# Patient Record
Sex: Female | Born: 2005 | Race: Black or African American | Hispanic: No | Marital: Single | Smoking: Never smoker
Health system: Southern US, Community
[De-identification: ages and names within clinical notes are randomized; demographics above are authoritative.]

## PROBLEM LIST (undated history)

## (undated) DIAGNOSIS — L309 Dermatitis, unspecified: Secondary | ICD-10-CM

## (undated) DIAGNOSIS — R519 Headache, unspecified: Secondary | ICD-10-CM

## (undated) HISTORY — DX: Dermatitis, unspecified: L30.9

## (undated) HISTORY — DX: Headache, unspecified: R51.9

---
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
## 2023-01-06 ENCOUNTER — Telehealth (INDEPENDENT_AMBULATORY_CARE_PROVIDER_SITE_OTHER): Payer: Self-pay

## 2023-01-06 NOTE — Telephone Encounter (Signed)
Attempted to contact patients' parent to schedule New Patient appointment.    Parent unable to be reached.   LVM to call back.   SS, CCMA  

## 2023-01-14 DIAGNOSIS — F411 Generalized anxiety disorder: Secondary | ICD-10-CM | POA: Diagnosis not present

## 2023-01-14 DIAGNOSIS — F902 Attention-deficit hyperactivity disorder, combined type: Secondary | ICD-10-CM | POA: Diagnosis not present

## 2023-01-14 DIAGNOSIS — F330 Major depressive disorder, recurrent, mild: Secondary | ICD-10-CM | POA: Diagnosis not present

## 2023-01-28 DIAGNOSIS — F902 Attention-deficit hyperactivity disorder, combined type: Secondary | ICD-10-CM | POA: Diagnosis not present

## 2023-01-28 DIAGNOSIS — F411 Generalized anxiety disorder: Secondary | ICD-10-CM | POA: Diagnosis not present

## 2023-01-28 DIAGNOSIS — F330 Major depressive disorder, recurrent, mild: Secondary | ICD-10-CM | POA: Diagnosis not present

## 2023-02-11 DIAGNOSIS — F411 Generalized anxiety disorder: Secondary | ICD-10-CM | POA: Diagnosis not present

## 2023-02-11 DIAGNOSIS — F330 Major depressive disorder, recurrent, mild: Secondary | ICD-10-CM | POA: Diagnosis not present

## 2023-02-11 DIAGNOSIS — F902 Attention-deficit hyperactivity disorder, combined type: Secondary | ICD-10-CM | POA: Diagnosis not present

## 2023-02-25 DIAGNOSIS — F330 Major depressive disorder, recurrent, mild: Secondary | ICD-10-CM | POA: Diagnosis not present

## 2023-02-25 DIAGNOSIS — F411 Generalized anxiety disorder: Secondary | ICD-10-CM | POA: Diagnosis not present

## 2023-02-25 DIAGNOSIS — F902 Attention-deficit hyperactivity disorder, combined type: Secondary | ICD-10-CM | POA: Diagnosis not present

## 2023-03-11 DIAGNOSIS — F411 Generalized anxiety disorder: Secondary | ICD-10-CM | POA: Diagnosis not present

## 2023-03-11 DIAGNOSIS — F902 Attention-deficit hyperactivity disorder, combined type: Secondary | ICD-10-CM | POA: Diagnosis not present

## 2023-03-11 DIAGNOSIS — F330 Major depressive disorder, recurrent, mild: Secondary | ICD-10-CM | POA: Diagnosis not present

## 2023-03-25 DIAGNOSIS — F411 Generalized anxiety disorder: Secondary | ICD-10-CM | POA: Diagnosis not present

## 2023-03-25 DIAGNOSIS — F902 Attention-deficit hyperactivity disorder, combined type: Secondary | ICD-10-CM | POA: Diagnosis not present

## 2023-03-25 DIAGNOSIS — F330 Major depressive disorder, recurrent, mild: Secondary | ICD-10-CM | POA: Diagnosis not present

## 2023-04-06 DIAGNOSIS — N760 Acute vaginitis: Secondary | ICD-10-CM | POA: Diagnosis not present

## 2023-04-08 DIAGNOSIS — F411 Generalized anxiety disorder: Secondary | ICD-10-CM | POA: Diagnosis not present

## 2023-04-08 DIAGNOSIS — F902 Attention-deficit hyperactivity disorder, combined type: Secondary | ICD-10-CM | POA: Diagnosis not present

## 2023-04-08 DIAGNOSIS — F330 Major depressive disorder, recurrent, mild: Secondary | ICD-10-CM | POA: Diagnosis not present

## 2023-04-22 DIAGNOSIS — F902 Attention-deficit hyperactivity disorder, combined type: Secondary | ICD-10-CM | POA: Diagnosis not present

## 2023-04-22 DIAGNOSIS — F330 Major depressive disorder, recurrent, mild: Secondary | ICD-10-CM | POA: Diagnosis not present

## 2023-04-22 DIAGNOSIS — F411 Generalized anxiety disorder: Secondary | ICD-10-CM | POA: Diagnosis not present

## 2023-04-28 DIAGNOSIS — R519 Headache, unspecified: Secondary | ICD-10-CM | POA: Diagnosis not present

## 2023-04-28 DIAGNOSIS — Z13220 Encounter for screening for lipoid disorders: Secondary | ICD-10-CM | POA: Diagnosis not present

## 2023-04-28 DIAGNOSIS — R350 Frequency of micturition: Secondary | ICD-10-CM | POA: Diagnosis not present

## 2023-04-28 DIAGNOSIS — Z23 Encounter for immunization: Secondary | ICD-10-CM | POA: Diagnosis not present

## 2023-04-28 DIAGNOSIS — R110 Nausea: Secondary | ICD-10-CM | POA: Diagnosis not present

## 2023-04-28 DIAGNOSIS — F411 Generalized anxiety disorder: Secondary | ICD-10-CM | POA: Diagnosis not present

## 2023-04-28 DIAGNOSIS — R7303 Prediabetes: Secondary | ICD-10-CM | POA: Diagnosis not present

## 2023-04-28 DIAGNOSIS — F320 Major depressive disorder, single episode, mild: Secondary | ICD-10-CM | POA: Diagnosis not present

## 2023-04-28 DIAGNOSIS — K219 Gastro-esophageal reflux disease without esophagitis: Secondary | ICD-10-CM | POA: Diagnosis not present

## 2023-04-28 DIAGNOSIS — Z0000 Encounter for general adult medical examination without abnormal findings: Secondary | ICD-10-CM | POA: Diagnosis not present

## 2023-04-28 DIAGNOSIS — N760 Acute vaginitis: Secondary | ICD-10-CM | POA: Diagnosis not present

## 2023-05-06 DIAGNOSIS — F411 Generalized anxiety disorder: Secondary | ICD-10-CM | POA: Diagnosis not present

## 2023-05-06 DIAGNOSIS — F902 Attention-deficit hyperactivity disorder, combined type: Secondary | ICD-10-CM | POA: Diagnosis not present

## 2023-05-06 DIAGNOSIS — F330 Major depressive disorder, recurrent, mild: Secondary | ICD-10-CM | POA: Diagnosis not present

## 2023-05-08 ENCOUNTER — Ambulatory Visit (INDEPENDENT_AMBULATORY_CARE_PROVIDER_SITE_OTHER): Payer: Self-pay | Admitting: Pediatrics

## 2023-06-03 DIAGNOSIS — F902 Attention-deficit hyperactivity disorder, combined type: Secondary | ICD-10-CM | POA: Diagnosis not present

## 2023-06-03 DIAGNOSIS — F330 Major depressive disorder, recurrent, mild: Secondary | ICD-10-CM | POA: Diagnosis not present

## 2023-06-03 DIAGNOSIS — F411 Generalized anxiety disorder: Secondary | ICD-10-CM | POA: Diagnosis not present

## 2023-06-17 DIAGNOSIS — F411 Generalized anxiety disorder: Secondary | ICD-10-CM | POA: Diagnosis not present

## 2023-06-17 DIAGNOSIS — F902 Attention-deficit hyperactivity disorder, combined type: Secondary | ICD-10-CM | POA: Diagnosis not present

## 2023-06-17 DIAGNOSIS — F330 Major depressive disorder, recurrent, mild: Secondary | ICD-10-CM | POA: Diagnosis not present

## 2023-06-24 DIAGNOSIS — F330 Major depressive disorder, recurrent, mild: Secondary | ICD-10-CM | POA: Diagnosis not present

## 2023-07-01 DIAGNOSIS — F330 Major depressive disorder, recurrent, mild: Secondary | ICD-10-CM | POA: Diagnosis not present

## 2023-07-14 ENCOUNTER — Ambulatory Visit (HOSPITAL_COMMUNITY): Payer: BC Managed Care – PPO | Admitting: Family

## 2023-07-14 ENCOUNTER — Encounter (HOSPITAL_COMMUNITY): Payer: Self-pay | Admitting: Family

## 2023-07-14 VITALS — BP 118/77 | HR 80 | Ht 60.0 in | Wt 232.0 lb

## 2023-07-14 DIAGNOSIS — F4320 Adjustment disorder, unspecified: Secondary | ICD-10-CM | POA: Diagnosis not present

## 2023-07-14 DIAGNOSIS — K219 Gastro-esophageal reflux disease without esophagitis: Secondary | ICD-10-CM | POA: Diagnosis not present

## 2023-07-14 DIAGNOSIS — K5909 Other constipation: Secondary | ICD-10-CM | POA: Diagnosis not present

## 2023-07-14 DIAGNOSIS — R1084 Generalized abdominal pain: Secondary | ICD-10-CM | POA: Diagnosis not present

## 2023-07-14 MED ORDER — HYDROXYZINE PAMOATE 25 MG PO CAPS: 25.0000 mg | ORAL_CAPSULE | Freq: Every evening | ORAL | 0 refills | Status: AC | PRN

## 2023-07-14 NOTE — Progress Notes (Unsigned)
Psychiatric Initial Adult Assessment   Patient Identification: Carolyn Aguilar MRN:  [id number] Date of Evaluation:  07/15/2023 Referral Source: Primary care provider Chief Complaint:  " She needs some type of testing and she is not doing what she needs to do as a 17 year old, she seem to be behind mental as compared to her cousins"  Visit Diagnosis:    ICD-10-CM   1. Adjustment disorder, unspecified type  F43.20       History of Present Illness:  Carolyn Aguilar 17 year old African-American female presents to establish care.  Patient is accompanied by her mother.  Patient initially reports concerns with her memory.  Mother reports patient is currently followed by neurology however was advised to follow-up here for additional testing.  Mother states" she was 42 years old and she acts like a 17 year old."  Reports she is not successful unable to attend to daily affairs.  She denied that patient is ever been diagnosed with a learning disability, autism or experienced traumatic brain injury.  States patient has completed high school.  Mother reports compared to her cousins and siblings patient is not able to hold a job.  Denied that patient had IEP in school.   During this evaluation Carolyn Aguilar is very concrete, pleasant, cooperative.  Chart reviewed patient was followed by psychiatrist Carolyn Aguilar however,  states she did not take medications as directed.  States that Concerta made her symptoms worse as she reports decreased appetite, mood irritability and passive suicidal ideations. Mother reported " I think she is a hypochondriac because she mimicked all the symptoms that the psychiatrist told her could be possible with taking medications."   Carolyn Aguilar denied previous inpatient admissions.  Reports she was followed by psychiatrist 2 years prior however had only 1 visit prior to 17 year old.  States she is currently prescribed sertraline 100 mg that she has been taking and tolerating well.  States her  medication continued prescribed by her primary care provider denied illicit drug use or substance abuse history.  Adjustment disorder unspecified: Continue sertraline 100 mg daily Initiated hydroxyzine 25 mg p.o. nightly as needed may repeat dose  Carolyn Aguilar is sitting  she is alert/oriented x 4; calm/cooperative; and mood congruent with affect.  Patient is speaking in a clear tone at moderate volume, and normal pace; with good eye contact. Her thought process is coherent and relevant, limited and concrete; There is no indication that she is currently responding to internal/external stimuli or experiencing delusional thought content.  Patient denies suicidal/self-harm/homicidal ideation, psychosis, and paranoia.  Patient has remained calm throughout assessment and has answered questions appropriately.    Associated Signs/Symptoms: Depression Symptoms:  difficulty concentrating, (Hypo) Manic Symptoms:  Distractibility, Anxiety Symptoms:  Excessive Worry, Psychotic Symptoms:  Hallucinations: None PTSD Symptoms: NA  Past Psychiatric History:   Previous Psychotropic Medications: Yes currently she is prescribed sertraline 100 mg daily  Substance Abuse History in the last 12 months:  No.  Consequences of Substance Abuse: NA  Past Medical History:  Past Medical History:  Diagnosis Date   Asthma    Headache    History reviewed. No pertinent surgical history.  Family Psychiatric History:   Family History:  Family History  Problem Relation Age of Onset   Hypertension Mother    Diabetes Paternal Grandfather    Migraines Neg Hx     Social History:   Social History   Socioeconomic History   Marital status: Single    Spouse name: Not on file   Number  of children: Not on file   Years of education: Not on file   Highest education level: Not on file  Occupational History   Not on file  Tobacco Use   Smoking status: Never   Smokeless tobacco: Never  Vaping Use   Vaping  status: Never Used  Substance and Sexual Activity   Alcohol use: Never   Drug use: Never   Sexual activity: Never  Other Topics Concern   Not on file  Social History Narrative   12 th grade at [employer]   Lives at home with parents    Social Determinants of Health   Financial Resource Strain: Not on file  Food Insecurity: Food Insecurity Present (12/12/2022)   Hunger Vital Sign    Worried About Running Out of Food in the Last Year: Sometimes true    Ran Out of Food in the Last Year: Sometimes true  Transportation Needs: No Transportation Needs (12/12/2022)   PRAPARE - Administrator, Civil Service (Medical): No    Lack of Transportation (Non-Medical): No  Physical Activity: Not on file  Stress: Not on file  Social Connections: Not on file    Additional Social History:   Allergies:  No Known Allergies  Metabolic Disorder Labs: Lab Results  Component Value Date   HGBA1C 5.6 12/12/2022   No results found for: "PROLACTIN" No results found for: "CHOL", "TRIG", "HDL", "CHOLHDL", "VLDL", "LDLCALC" No results found for: "TSH"  Therapeutic Level Labs: No results found for: "LITHIUM" No results found for: "CBMZ" No results found for: "VALPROATE"  Current Medications: Current Outpatient Medications  Medication Sig Dispense Refill   albuterol (PROVENTIL HFA;VENTOLIN HFA) 108 (90 Base) MCG/ACT inhaler Inhale 2 puffs into the lungs every 6 (six) hours as needed. For shortness of breath 8 g 0   albuterol (PROVENTIL) (2.5 MG/3ML) 0.083% nebulizer solution Take 3 mLs (2.5 mg total) by nebulization every 6 (six) hours as needed for wheezing or shortness of breath. 75 mL 0   beclomethasone (QVAR) 40 MCG/ACT inhaler Inhale 2 puffs into the lungs 2 (two) times daily.     budesonide (PULMICORT) 0.5 MG/2ML nebulizer solution Take by nebulization daily.     cetirizine (ZYRTEC) 10 MG tablet 1 tablet Orally Once a day for 90 days     Ferrous Sulfate (IRON PO) Take by  mouth.     fluticasone (FLONASE) 50 MCG/ACT nasal spray Place 2 sprays into both nostrils daily.     hydrOXYzine (VISTARIL) 25 MG capsule Take 1 capsule (25 mg total) by mouth at bedtime and may repeat dose one time if needed. 60 capsule 0   hyoscyamine (LEVSIN) 0.125 MG tablet Take by mouth every morning.     JUNEL FE 1.5/30 1.5-30 MG-MCG tablet Take 1 tablet by mouth daily.     montelukast (SINGULAIR) 10 MG tablet Take 10 mg by mouth at bedtime.     omeprazole (PRILOSEC) 40 MG capsule TAKE 1 CAPSULE BY MOUTH EVERY DAY 30 MINUTES BEFORE BREAKFAST     ondansetron (ZOFRAN-ODT) 4 MG disintegrating tablet Take 1 tablet (4 mg total) by mouth every 8 (eight) hours as needed. 20 tablet 1   sertraline (ZOLOFT) 100 MG tablet Take 100 mg by mouth daily.     topiramate (TOPAMAX) 25 MG tablet TAKE 1 TABLET(25 MG) BY MOUTH TWICE DAILY 62 tablet 5   azithromycin (ZITHROMAX) 250 MG tablet Take 1 tablet (250 mg total) by mouth daily. Take first 2 tablets together, then 1 every day  until finished. (Patient not taking: Reported on 07/14/2023) 6 tablet 0   No current facility-administered medications for this visit.    Musculoskeletal: Strength & Muscle Tone: within normal limits Gait & Station: normal Patient leans: N/A  Psychiatric Specialty Exam: Review of Systems  Constitutional: Negative.   HENT: Negative.    Genitourinary: Negative.   Psychiatric/Behavioral:  Positive for decreased concentration. Negative for sleep disturbance and suicidal ideas. The patient is nervous/anxious.   All other systems reviewed and are negative.   Blood pressure 118/77, pulse 80, height 5' (1.524 m), weight 232 lb (105.2 kg).Body mass index is 45.31 kg/m.  General Appearance: Casual  Eye Contact:  Good  Speech:  Clear and Coherent  Volume:  Normal  Mood:  Anxious  Affect:  Congruent  Thought Process:  Coherent  Orientation:  Full (Time, Place, and Person)  Thought Content:  Logical child like, playful and  repetitive conversation... "So like I am saying, I act like my mom"  Suicidal Thoughts:  No  Homicidal Thoughts:  No  Memory:  Immediate;   Fair Recent;   Fair  Judgement:  Fair  Insight:  Lacking  Psychomotor Activity:  Normal  Concentration:  Concentration: Poor  Recall:  Fair  Fund of Knowledge:Poor  Language: Good  Akathisia:  No  Handed:  Right  AIMS (if indicated):  not done  Assets:  Communication Skills Desire for Improvement Resilience Social Support  ADL's:  Intact  Cognition: n/a  Sleep:  Poor   Screenings: PHQ2-9    Flowsheet Row [facility] Office Visit from 06/18/2021 in [facility] Nutrition from 08/20/2020 in [facility]  - A Dept Of [place] [place]  PHQ-2 Total Score 4 1 1   PHQ-9 Total Score 15 -- --      Flowsheet Row ED from 05/26/2022 in [facility]) ED from 11/05/2020 in [facility] at [facility]  C-SSRS RISK CATEGORY No Risk No Risk       Assessment and Plan: Aslin Kruszynski 17 year old female presents to establish care.  Mother had concerns relating to patient's processing ability.  States she has never been diagnosed with autism, Mental Retardation or (IDD) intellectual disability.  Reports she was referred by her neurologist due to inability to concentrate and reported memory issues.  Patient was provided outpatient resources.  Psychological testing consideration for vocational rehab.  Referred for psychological testing for IQ (testing/diagnoses)   Adjustment disorder unspecified: Continue sertraline 100 mg daily Initiated hydroxyzine 25 mg p.o. nightly as needed may repeat dose  Collaboration of Care: Other psychologist Myrene Galas or Rosalita Chessman  [facility]  Patient/Guardian was advised Release of Information must  be obtained prior to any record release in order to collaborate their care with an outside provider. Patient/Guardian was advised if they have not already done so to contact the registration department to sign all necessary forms in order for [place] to release information regarding their care.   Consent: Patient/Guardian gives verbal consent for treatment and assignment of benefits for services provided during this visit. Patient/Guardian expressed understanding and agreed to proceed.   Oneta Rack, NP 11/27/20242:19 PM

## 2023-07-20 DIAGNOSIS — K219 Gastro-esophageal reflux disease without esophagitis: Secondary | ICD-10-CM | POA: Diagnosis not present

## 2023-07-20 DIAGNOSIS — R1084 Generalized abdominal pain: Secondary | ICD-10-CM | POA: Diagnosis not present

## 2023-07-23 DIAGNOSIS — F330 Major depressive disorder, recurrent, mild: Secondary | ICD-10-CM | POA: Diagnosis not present

## 2023-08-06 DIAGNOSIS — Z23 Encounter for immunization: Secondary | ICD-10-CM | POA: Diagnosis not present

## 2023-08-06 DIAGNOSIS — E559 Vitamin D deficiency, unspecified: Secondary | ICD-10-CM | POA: Diagnosis not present

## 2023-08-10 DIAGNOSIS — N898 Other specified noninflammatory disorders of vagina: Secondary | ICD-10-CM | POA: Diagnosis not present

## 2023-08-10 DIAGNOSIS — L309 Dermatitis, unspecified: Secondary | ICD-10-CM | POA: Diagnosis not present

## 2023-08-10 DIAGNOSIS — R399 Unspecified symptoms and signs involving the genitourinary system: Secondary | ICD-10-CM | POA: Diagnosis not present

## 2023-09-09 ENCOUNTER — Other Ambulatory Visit (INDEPENDENT_AMBULATORY_CARE_PROVIDER_SITE_OTHER): Payer: Self-pay | Admitting: Pediatrics

## 2023-09-09 DIAGNOSIS — F902 Attention-deficit hyperactivity disorder, combined type: Secondary | ICD-10-CM | POA: Diagnosis not present

## 2023-09-09 DIAGNOSIS — F411 Generalized anxiety disorder: Secondary | ICD-10-CM | POA: Diagnosis not present

## 2023-09-09 DIAGNOSIS — F330 Major depressive disorder, recurrent, mild: Secondary | ICD-10-CM | POA: Diagnosis not present

## 2023-09-11 DIAGNOSIS — K5909 Other constipation: Secondary | ICD-10-CM | POA: Diagnosis not present

## 2023-09-11 DIAGNOSIS — K219 Gastro-esophageal reflux disease without esophagitis: Secondary | ICD-10-CM | POA: Diagnosis not present

## 2023-09-11 DIAGNOSIS — R1084 Generalized abdominal pain: Secondary | ICD-10-CM | POA: Diagnosis not present

## 2023-09-14 ENCOUNTER — Other Ambulatory Visit: Payer: Self-pay | Admitting: Internal Medicine

## 2023-09-14 DIAGNOSIS — R1084 Generalized abdominal pain: Secondary | ICD-10-CM

## 2023-09-22 ENCOUNTER — Other Ambulatory Visit: Payer: BC Managed Care – PPO

## 2023-09-28 ENCOUNTER — Ambulatory Visit
Admission: RE | Admit: 2023-09-28 | Discharge: 2023-09-28 | Disposition: A | Discharge status: Home / Self Care | Payer: BC Managed Care – PPO | Source: Ambulatory Visit | Attending: Internal Medicine

## 2023-09-28 DIAGNOSIS — R1084 Generalized abdominal pain: Secondary | ICD-10-CM

## 2023-09-28 DIAGNOSIS — R109 Unspecified abdominal pain: Secondary | ICD-10-CM | POA: Diagnosis not present

## 2023-10-01 DIAGNOSIS — F902 Attention-deficit hyperactivity disorder, combined type: Secondary | ICD-10-CM | POA: Diagnosis not present

## 2023-10-01 DIAGNOSIS — F411 Generalized anxiety disorder: Secondary | ICD-10-CM | POA: Diagnosis not present

## 2023-10-01 DIAGNOSIS — F330 Major depressive disorder, recurrent, mild: Secondary | ICD-10-CM | POA: Diagnosis not present

## 2023-10-15 DIAGNOSIS — F902 Attention-deficit hyperactivity disorder, combined type: Secondary | ICD-10-CM | POA: Diagnosis not present

## 2023-10-15 DIAGNOSIS — F411 Generalized anxiety disorder: Secondary | ICD-10-CM | POA: Diagnosis not present

## 2023-10-15 DIAGNOSIS — F330 Major depressive disorder, recurrent, mild: Secondary | ICD-10-CM | POA: Diagnosis not present

## 2023-10-29 DIAGNOSIS — F411 Generalized anxiety disorder: Secondary | ICD-10-CM | POA: Diagnosis not present

## 2023-10-29 DIAGNOSIS — F902 Attention-deficit hyperactivity disorder, combined type: Secondary | ICD-10-CM | POA: Diagnosis not present

## 2023-10-29 DIAGNOSIS — F330 Major depressive disorder, recurrent, mild: Secondary | ICD-10-CM | POA: Diagnosis not present

## 2023-11-02 ENCOUNTER — Ambulatory Visit: Payer: BC Managed Care – PPO | Admitting: Psychology

## 2023-11-16 ENCOUNTER — Ambulatory Visit: Payer: BC Managed Care – PPO | Admitting: Psychology

## 2023-11-16 ENCOUNTER — Encounter: Payer: Self-pay | Admitting: Psychology

## 2023-11-16 DIAGNOSIS — F909 Attention-deficit hyperactivity disorder, unspecified type: Secondary | ICD-10-CM | POA: Diagnosis not present

## 2023-11-16 DIAGNOSIS — F419 Anxiety disorder, unspecified: Secondary | ICD-10-CM

## 2023-11-16 DIAGNOSIS — F339 Major depressive disorder, recurrent, unspecified: Secondary | ICD-10-CM

## 2023-11-16 NOTE — Progress Notes (Deleted)
   Bryson Dames, PhD

## 2023-11-16 NOTE — Progress Notes (Signed)
 [facility] Counselor/Therapist Progress Note  Patient ID: Carolyn Aguilar, MRN: [id number],    Date: 11/16/2023  Time Spent: 1:30 - 3:30 pm    Treatment Type: Testing  Met with patient for testing session.  Patient was at the clinic and session was conducted from therapist's office in person.    Reported Symptoms/Reason for Referral:  Patient presents with a history of social interaction and cognitive processing deficits resulting in social and learning difficulty during childhood along with delayed independence as an adult.  Patient has been previously diagnosed with ADHD, along with anxiety and depressive disorders, but Autism spectrum disorder (ASD) is suspected due to sensory hypersensitivity, resistance to change and overly intense interests along with the social interaction difficulty.  Testing recommended to evaluate for ASD along with other conditions that may be affecting social interaction and behavior.     Mental Status Exam: Appearance:  Casual and Neatly Groomed     Behavior: Appropriate  Motor: Normal  Speech/Language:  Clear and Coherent and Normal Rate  Affect: Appropriate  Mood: euthymic  Thought process: normal  Thought content:   WNL  Sensory/Perceptual disturbances:   WNL  Orientation: oriented to person, place, time/date, and situation  Attention: Good  Concentration: Fair  Memory: WNL  Fund of knowledge:  Fair  Insight:   Fair  Judgment:  Good  Impulse Control: Good   Risk Assessment: Danger to Self:  No Self-injurious Behavior: No Danger to Others: No Duty to Warn:no Physical Aggression / Violence:No   Subjective: Testing included the K-BIT 2R (1.0 hrs. for testing and scoring) along with the CNS Vital signs (1.0 hrs.).  Parents were sent the [place] 3 and SRS-2 to complete online prior to next session.    Patient was cooperative and displayed good effort. Attention and concentration were adequate overall, although patient exhibited  asking questions to be repeated and missing relatively easy questions or problems.  Patient processed information slowly leading to longer than expected test administration times.  Mood was euthymic with appropriate affect.  The results appear representative of current functioning.    Diagnosis:Attention deficit hyperactivity disorder (ADHD), unspecified ADHD type  Anxiety disorder, unspecified type  Episode of recurrent major depressive disorder, unspecified depression episode severity (HCC)  Plan: Testing to continue next session with the ADOS 2 Module 4, BRIEF-2A, CAARS-2, and SRS-2 followed by report writing and interactive feedback.    Bryson Dames, PhD

## 2023-11-16 NOTE — Progress Notes (Signed)
 [facility] Counselor Initial Adult Exam  Name: Carolyn Aguilar Date: 11/16/2023 MRN: [id number] DOB: 2005-11-02 PCP: Wilfrid Lund, PA  Time spent: 12:30 - 1:30 pm  Guardian/Informant:  Milagros Loll - Patient Carolyn Aguilar - Mother     Paperwork requested: No  Met with patient and parent for initial interview.  Patient and parent were at the clinic and session was conducted from therapist's office in person.   Reason for Visit /Presenting Problem: Seeking testing for ASD.  Patient reported having difficulty making friends and getting to know people.  Feels likes she is older than her peers.  Patient not sure why she struggles mentally and physically.  Trouble finding things that are easily located.  Has a short attention span and it is difficulty for her to complete tasks.  Moves her legs constantly and always feels anxious.  Currently diagnosed with ADHD.  Startled easily by noise (car horns, yelling).  Struggles with memory loss (forgetting where she puts things.  Has much headaches.  Feels like she needs help growing up, having trouble grapsing adult life.  Afraid to drive, get a job.  Struggles with interviewing and socializing       Mental Status Exam: Appearance:   Casual and Neat     Behavior:  Appropriate and Sharing  Motor:  Restlestness  Speech/Language:   Clear and Coherent, Normal Rate, and excessive  Affect:  Appropriate  Mood:  euthymic  Thought process:  tangential and overly detailed  Thought content:    WNL  Sensory/Perceptual disturbances:    WNL  Orientation:  oriented to person, place, time/date, and situation  Attention:  Good  Concentration:  Fair  Memory:  WNL  Fund of knowledge:   Fair  Insight:    Fair  Judgment:   Good  Impulse Control:  Good   Developmental History: Early delays - Mild Delays for early milestones.  Larey Seat out of a basket at age 60.  Hit head but didn't have a concussion.  Was delayed in social-emotional development overall  compared to cousin (3 months apart).       Motor - Clumsy, runs into things often and hard to stay balanced. Trouble running due to asthma.  Handwriting is adequate but slow.  Struggles with buttons, ok with zippers but takes time, can tie shoes but not tightly.  Hard time opening jars.  Used to sing in a choir.      Speech - Adequate but people don't understand her.  Can speak quickly and babble a lot, especially when anxious. Self Care - Good - sometimes forgets to eats when rushed.   Independent - Can do the dishes but takes a long time.  Will sometimes do laundry.  Does not drive and has not been able to find a job.  Will sometimes call to reschedule appointments. Not able to tell time or count change.    Social - Friends are mostly Therapist, sports.  Currently not speaking to friends she met at school.  It takes much effort for patient to get to know people.  Has social interest but struggles making and keeping friends.      Reported Symptoms:  Trouble falling asleep.  Takes Melatonin.  Sometimes can fall asleep on own if really tired.  Sometimes has very low appetite.  Other times will excessively.  Tries to eat healthy food (fruits and veggies) but not consistent. Low energy during most days.  Periods of prolonged sadness (depressed & lonely).  Been happening  for the past few months.  Low SE, hopeless, and helpless at times.  Some sudden panic/anxiety.  Triggered by feeling rushed.  Fears that will not figure out what is wrong with her, she continue to feel the way she has, and that her dog will not heal. Has general worry and social anxiety.  Previous intrusive thoughts of harm.  Tried to cut self during middle school.  Last time had thoughts was a few months ago.  No compulsive behavior.  Trouble paying attention, easily distracted, frequent losing and forgetting (medication).  Well organized with school work but not with home materials.  Restless/fidgety.  Frequent interrupting/no filter.  Some impulsive  behavior.  Struggles relating to peers.  Prefers adults or younger children.  Struggles with jokes, sarcasm and reading social-emotional cues.  No close relationships outisde of family.  Online friends only.  No repetitive speech or behavior but will bring up the same topics repeatedly.  Overly intense interest in pets.  Becomes hyper-focused when she sees a dog or engages in [employer] games.  Struggles adapting to change and transition.  Hypersensitive to noise (car horns, yelling).            Risk Assessment: Danger to Self:   Not currently but thoughts of self harm in the past.  Last time 3 months ago. Self-injurious Behavior:  Tried to cut self during middle school.   Danger to Others: No Duty to Warn:no Physical Aggression / Violence: Become aggressive when upset (mostly verbal) Access to Firearms a concern: No  Gang Involvement:No  Patient / guardian was educated about steps to take if suicide or homicide risk level increases between visits: yes While future psychiatric events cannot be accurately predicted, the patient does not currently require acute inpatient psychiatric care and does not currently meet [facility] involuntary commitment criteria.  Substance Abuse History: Current substance abuse: No     Past Psychiatric History:   Previous psychological history is significant for ADHD, anxiety, and depression, dyslexia.   Outpatient Providers:[facility].  Been seeing her for 2-3 months but had a prior therapist at the same place.  PCP follows her medication.   History of Psych Hospitalization: No  Psychological Testing:  None - not even for ADHD.    Abuse History:  Victim of: No.,  None but witnessed brother having a seizure which was traumatic for her. Mother having cancer was traumatic as well.     Report needed: No. Victim of Neglect:No. Perpetrator of  None   Witness / Exposure to Domestic Violence: No   Protective Services  Involvement: No  Witness to [employer] Violence:  No   Family History:  Family History  Problem Relation Age of Onset   Hypertension Mother    Diabetes Paternal Grandfather    Migraines Neg Hx   Aunt - alcohol and drug abuse, older sister is emotionally volatile.  Father has alcohol problem and has thoughts of suicide.    Living situation: Patient lives with their family (parents and brother 24 years).  Gets along adequately with family.  Will joke and play with them, but they will sometimes trigger her.  Family doesn't always understand patient, especially father and brother.    Sexual Orientation: Straight  Relationship Status: single  Name of spouse / other:None - dated people from special ed. Class but never went on an actual date with them.    If a parent, number of children / ages:None  Support Systems: Mother  Financial Stress:   Some per mother - has to pay for patient's needs and patient not supporting self or family financially  Income/Employment/Disability: Supported by [employer].  Worked at a job for one month last year ([employer]).  Had trouble working their hours.  Needed help getting tasks done without assistance.   Military Service: No   Educational History: Education: high school diploma/GED Graduated [employer] last year (2024).  Participated in a couple of clubs her last year for the first.  Was in regular classes but could access the learning hub at the end of the school day.  Struggled academically.  No interest in attending college (Applied to [employer] but too much pressure and didn't go through with it.)  Had poor peer relations at school.         Any cultural differences that may affect / interfere with treatment:  Black/AA  Recreation/Hobbies: Pets, [employer] games, TV you tube, listening to music, eating.    Stressors: Other: Social issues - when sees others in a couple relationship (e.g., weddings, baby showers, valentines day etc.    Strengths:  Pets, babies  Barriers:  ADHD, anxiety, frequent pain (stomach and headaches).   Legal History: Pending legal issue / charges: The patient has no significant history of legal issues.  Medical History/Surgical History: reviewed Past Medical History:  Diagnosis Date   Asthma    Headache   Stomach aches Back and feet pain, Overweight Acid Reflux  No past surgical history on file.  Medications: Current Outpatient Medications  Medication Sig Dispense Refill   albuterol (PROVENTIL HFA;VENTOLIN HFA) 108 (90 Base) MCG/ACT inhaler Inhale 2 puffs into the lungs every 6 (six) hours as needed. For shortness of breath 8 g 0   albuterol (PROVENTIL) (2.5 MG/3ML) 0.083% nebulizer solution Take 3 mLs (2.5 mg total) by nebulization every 6 (six) hours as needed for wheezing or shortness of breath. 75 mL 0   azithromycin (ZITHROMAX) 250 MG tablet Take 1 tablet (250 mg total) by mouth daily. Take first 2 tablets together, then 1 every day until finished. (Patient not taking: Reported on 07/14/2023) 6 tablet 0   beclomethasone (QVAR) 40 MCG/ACT inhaler Inhale 2 puffs into the lungs 2 (two) times daily.     budesonide (PULMICORT) 0.5 MG/2ML nebulizer solution Take by nebulization daily.     cetirizine (ZYRTEC) 10 MG tablet 1 tablet Orally Once a day for 90 days     Ferrous Sulfate (IRON PO) Take by mouth.     fluticasone (FLONASE) 50 MCG/ACT nasal spray Place 2 sprays into both nostrils daily.     hydrOXYzine (VISTARIL) 25 MG capsule Take 1 capsule (25 mg total) by mouth at bedtime and may repeat dose one time if needed. 60 capsule 0   hyoscyamine (LEVSIN) 0.125 MG tablet Take by mouth every morning.     JUNEL FE 1.5/30 1.5-30 MG-MCG tablet Take 1 tablet by mouth daily.     montelukast (SINGULAIR) 10 MG tablet Take 10 mg by mouth at bedtime.     omeprazole (PRILOSEC) 40 MG capsule TAKE 1 CAPSULE BY MOUTH EVERY DAY 30 MINUTES BEFORE BREAKFAST     ondansetron (ZOFRAN-ODT) 4 MG disintegrating tablet  DISSOLVE 1 TABLET(4 MG) ON THE TONGUE EVERY 8 HOURS AS NEEDED 20 tablet 1   sertraline (ZOLOFT) 100 MG tablet Take 100 mg by mouth daily.     topiramate (TOPAMAX) 25 MG tablet TAKE 1 TABLET(25 MG) BY MOUTH TWICE DAILY 62 tablet 5   No  current facility-administered medications for this visit.  Amitryptaline, nebulzier setraline omeprazole, birth control, Singulair.  Previously tried [employer] but had too many side effects.     No Known Allergies just seasonal, nausea, constipation, no concussions seziures head injuries.    Diagnoses:  Attention deficit hyperactivity disorder (ADHD), unspecified ADHD type  Anxiety disorder, unspecified type  Episode of recurrent major depressive disorder, unspecified depression episode severity (HCC)  Plan of Care: patient presents with a history of social interaction and cognitive processing deficits result ing in social and learning difficulty during childhood along with delayed independence as an adult.  Patient has been previously diagnosed with ADHD, along with anxiety and depressive disorders, but Autism spectrum disorder (ASD) is suspected due to sensory hypersensitivity, resistance to change and overly intense interests along with the social interaction difficulty.  Testing recommended to evaluate for ASD along with other conditions that may be affecting social interaction and behavior.    Test Battery K-BIT-2R, CNSVS, BRIEF-2A (S), CAARS-2 (S), Adult OCD, DASS, ADOS-2 Module 4, SRS-2 (S & O), Vineland 3 (P).   Bryson Dames, PhD

## 2023-11-18 ENCOUNTER — Encounter: Payer: Self-pay | Admitting: Psychology

## 2023-11-18 ENCOUNTER — Ambulatory Visit: Payer: BC Managed Care – PPO | Admitting: Psychology

## 2023-11-18 DIAGNOSIS — F419 Anxiety disorder, unspecified: Secondary | ICD-10-CM | POA: Diagnosis not present

## 2023-11-18 DIAGNOSIS — F909 Attention-deficit hyperactivity disorder, unspecified type: Secondary | ICD-10-CM | POA: Diagnosis not present

## 2023-11-18 NOTE — Progress Notes (Signed)
 [facility] Counselor/Therapist Progress Note  Patient ID: Carolyn Aguilar, MRN: [id number],    Date: 11/18/2023  Time Spent: 12:30 - 3:00 pm    Treatment Type: Testing  Met with patient for testing session.  Patient was at the clinic and session was conducted from therapist's office in person.    Reported Symptoms/Reason for Referral:  Patient presents with a history of social interaction and cognitive processing deficits resulting in social and learning difficulty during childhood along with delayed independence as an adult.  Patient has been previously diagnosed with ADHD, along with anxiety and depressive disorders, but Autism spectrum disorder (ASD) is suspected due to sensory hypersensitivity, resistance to change and overly intense interests along with the social interaction difficulty.  Testing recommended to evaluate for ASD along with other conditions that may be affecting social interaction and behavior.     Mental Status Exam: Appearance:  Casual and Neatly Groomed     Behavior: Appropriate  Motor: Normal  Speech/Language:  Clear and Coherent and Normal Rate  Affect: Appropriate  Mood: euthymic  Thought process: normal  Thought content:   WNL  Sensory/Perceptual disturbances:   WNL  Orientation: oriented to person, place, time/date, and situation  Attention: Good  Concentration: Fair  Memory: WNL  Fund of knowledge:  Fair  Insight:   Fair  Judgment:  Good  Impulse Control: Good   Risk Assessment: Danger to Self:  No Self-injurious Behavior: No Danger to Others: No Duty to Warn:no Physical Aggression / Violence:No   Subjective: Testing included the ADOS 2 Module 4,  (1.75 hrs. for testing and scoring) along with the BRIEF-2A (0.25 hrs.), CAARS-2, (0.25 hrs.) and SRS-2 (0.25 hrs.).  Parents have yet to complete the Vineland 3 and SRS-2.    Patient was cooperative and displayed good effort. Attention and concentration were adequate overall, although  patient exhibited asking questions to be repeated and missing relatively easy questions or problems.  Patient processed information slowly leading to longer than expected test administration times.  Mood was euthymic with appropriate affect.  Patient was socially responsive and took some interest in the examiner, but initiated little conversation.  The results appear representative of current functioning.    Total time of Testing: 4.5 hrs. 11/16/23 - 2 hrs. 11/18/23 - 2.5 hrs.  Diagnosis:Attention deficit hyperactivity disorder (ADHD), unspecified ADHD type  Anxiety disorder, unspecified type  Plan: Testing complete pending return of parent rating forms. Report writing to be conducted followed by interactive feedback next session.   Bryson Dames, PhD                  Bryson Dames, PhD

## 2023-11-25 ENCOUNTER — Encounter: Payer: Self-pay | Admitting: Psychology

## 2023-11-25 NOTE — Progress Notes (Signed)
 Carolyn Aguilar is a 18 y.o. female patient Report writing competed ( 3 hrs.).  Interactive feedback to be conducted next session. Report to be attached to the feedback progress note.  Patient/Guardian was advised Release of Information must be obtained prior to any record release in order to collaborate their care with an outside provider. Patient/Guardian was advised if they have not already done so to contact the registration department to sign all necessary forms in order for [place] to release information regarding their care.   Consent: Patient/Guardian gives verbal consent for treatment and assignment of benefits for services provided during this visit. Patient/Guardian expressed understanding and agreed to proceed.    Bryson Dames, PhD

## 2023-11-26 DIAGNOSIS — F330 Major depressive disorder, recurrent, mild: Secondary | ICD-10-CM | POA: Diagnosis not present

## 2023-11-26 DIAGNOSIS — F902 Attention-deficit hyperactivity disorder, combined type: Secondary | ICD-10-CM | POA: Diagnosis not present

## 2023-11-26 DIAGNOSIS — F411 Generalized anxiety disorder: Secondary | ICD-10-CM | POA: Diagnosis not present

## 2023-12-03 ENCOUNTER — Ambulatory Visit: Payer: BC Managed Care – PPO | Admitting: Psychology

## 2023-12-03 ENCOUNTER — Encounter: Payer: Self-pay | Admitting: Psychology

## 2023-12-03 DIAGNOSIS — F411 Generalized anxiety disorder: Secondary | ICD-10-CM | POA: Diagnosis not present

## 2023-12-03 DIAGNOSIS — F840 Autistic disorder: Secondary | ICD-10-CM | POA: Diagnosis not present

## 2023-12-03 DIAGNOSIS — F331 Major depressive disorder, recurrent, moderate: Secondary | ICD-10-CM

## 2023-12-03 NOTE — Progress Notes (Signed)
 [facility] Counselor/Therapist Progress Note  Patient ID: Carolyn Aguilar, MRN: [id number],    Date: 12/03/2023  Time Spent: 9:30 - 10:20 am   Treatment Type: Testing - Feedback Session  Met with patient and parents to review results of testing.  Patient and parents were at home and session was conducted from therapist's office via video conferencing.  Patient understood the limitations of video appointments and verbally consented to telehealth.       Reported Symptoms: Patient presents with a history of social interaction and cognitive processing deficits resulting in social and learning difficulty during childhood along with delayed independence as an adult. Patient has been previously diagnosed with ADHD, along with anxiety and depressive disorders, but Autism spectrum disorder (ASD) is suspected due to sensory hypersensitivity, resistance to change and overly intense interests along with the social interaction difficulty. Testing recommended to evaluate for ASD along with other conditions that may be affecting social interaction and behavior.    Subjective: Interactive feedback was conducted (1 hr.).  It was discussed how patient met the criterion for Autism Spectrum Disorder, recurrent depression, and generalized anxiety  along with how these conditions affect her ability to learn and relate to others.      Recommendations included discussing results with PCP, continuing individual counseling, and seeking appropriate community vocational and social supports.  Patient and parents expressed agreement with the results and recommendations.     Total Time for Virtual Evaluation Services: 4 hrs. 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     Otis Portal,  PhD

## 2023-12-03 NOTE — Progress Notes (Signed)
   Carolyn Dames, PhD

## 2023-12-03 NOTE — Progress Notes (Signed)
 Psychological Testing Report - Confidential  Identifying Information:              Patient's Name:   Carolyn Aguilar  Date of Birth:   2005/11/22     Age:                18 years  MRN#:                                   [id number]      Dates of Assessment:  March 31, & November 18, 2023         Purpose of Evaluation:  The purpose of the evaluation is to provide diagnostic information and treatment recommendations.  Carolyn Aguilar and her mother Carolyn Aguilar were the primary informant for this evaluation.   Referral Information: Carolyn Aguilar was a 18 year old Black female referred for testing related to social and independent skill deficits.  Carolyn Aguilar reported having difficulty making friends and getting to know people.  She feels likes she different her peers and is not sure why she struggles mentally and physically.  Carolyn Aguilar indicated having trouble finding items that are easily located.  She has a short attention span, and it is difficulty for her to complete tasks.  She moves her legs constantly and always feels anxious.  Carolyn Aguilar was previously diagnosed with Attention Deficit Hyperactivity Disorder (ADHD).    She is startled easily by noise (car horns, yelling), struggles with memory loss (forgetting where she puts things), and has frequent headaches.  She feels like she needs help growing up, having trouble grasping adult life.  Carolyn Aguilar stated that she is afraid to drive and get a job.  She struggles with interviewing and socializing.  Autism Spectrum Disorder (ASD) was suspected.               Relevant Background Information:  Developmental history was reported to include mild delays for early milestones.  Carolyn Aguilar was reported to have fell out of a basket at age 66.  She hit head but did not have a concussion.  During childhood, Carolyn Aguilar was delayed in social-emotional development compared to cousin who was the same age.  Regarding current development, Carolyn Aguilar indicated being clumsy, running  into objects often and having difficulty staying balanced. She has trouble running due to Asthma.  Her handwriting is adequate but slow.  She struggles with fastening buttons, tying her shoes tightly, and opening jars.  Speech was reported to be adequately developed, but people don't seem to understand her.  She can speak quickly and babble a lot, especially when anxious.  Self-care was reported to be typically developed, although she sometimes forgets to eat when rushed.    Independent activity was reported to be delayed.  She can wash the dishes, but it takes a long time to do so.  She will sometimes wash her clothes but does not drive and has not been able to find a job.  She will sometimes call to reschedule appointments but is not able to tell time or count change.  Socially, most of her friends are online.  Currently, Carolyn Aguilar is not speaking to friends she met at school.  It takes much effort for Carolyn Aguilar to get to know people.  She has social interest but struggles making and keeping friends.         Medical history was  reported to be significant for Asthma, headaches, stomach aches back and feet pain, Obesity, and Acid Reflux.  Seasonal allergies were reported, along with frequent nausea and constipation.  A history of concussions, seizures, or head injuries was denied, as were extended hospitalizations or surgeries.  Current psychotropic medication use consists of Sertraline for anxiety/depressed mood.  She previously tried Vyvanse for ADHD, but it had too many side effects.  Other prescription medications include Amitriptyline, Nebulizer, Omeprazole, birth control, and Singulair.  Alcohol and recreational drug use were denied.  Previous psychological history is significant for ADHD, anxiety, and depression, and dyslexia.  She has been seeing Rhae Lerner at [facility] for psychotherapy.  Her Primary Care Physician monitors her medication.  Previous psychiatric hospitalization  and psychological testing were denied.      Educationally, Carolyn Aguilar reported earning her high school diploma.  She graduated from [employer] during 2024.  Carolyn Aguilar reported struggling academically.  She attended regular education classes but could access the learning hub at the end of the school day.  Carolyn Aguilar indicated having no interest in attending college.  She applied to [employer] but felt too much pressure and didn't complete the enrollment process.  Carolyn Aguilar reported having poor peer relations at school and not participating in any activities until her last year of high school.  Carolyn Aguilar is not working and is financially supported by her family.  She worked at a job for one-month last summer ([employer]) but had trouble working her assigned hours and getting tasks done without assistance.  Leisure activities include caring for her pets, playing simulation games, watching television and You Tube, and listening to music.     Carolyn Aguilar lives with her parents and 67 year old brother.  She reported getting along adequately with her family.  She will joke and play with them, but they will sometimes upset her.  Carolyn Aguilar believed that her family doesn't always understand her, especially father and brother.  She has never been married and does not have any children.  She indicated having relationships with boys during school but never went on an actual date with them.  Carolyn Aguilar indicated being most supported by her mother.  Family mental health history was reported to be significant for an aunt with alcohol and drug abuse.  Her older sister, who does not live the family, was reported to be emotionally volatile while her father has an alcohol problem and has had thoughts of suicide.  Current stressors include social issues, becoming upset when seeing others in a couple relationship.  Strengths include relating to pets and babies.  Barriers to success include  attention deficits, anxiety, and frequent pain (stomach and headaches).   Presenting Symptomology:  Ms. Parrow reported having trouble falling asleep.  She typically takes Melatonin to sleep but can sometimes fall asleep on her own if tired.  Sometimes she has a very low appetite but other times it will be excessive.  She tries to eat healthy food (fruits and vegetables) but not consistently.  Low energy was reported during most days.  Periods of prolonged sadness, depressed mood, and loneliness were reported.  This has been happening for the past few months.  Low self-esteem, hopelessness, and helplessness at were reported at times.  Some instances of sudden panic/anxiety were reported, typically triggered by feeling rushed.  She currently fears that she will not figure out what is wrong with her, she will continue to feel  the way she has, and that her dog will not heal from his recent surgery. She indicated having frequent general worry and social anxiety.  Previous intrusive thoughts of harm were reported.  Ms. Fees tried to cut herself during middle school.  The last time she had thoughts was a few months ago.  Compulsive behavior was denied.  Ms. Funderburg reported having trouble paying attention and being easily distracted, with frequent losing and forgetting.  She was well organized with schoolwork but not with home materials.  She is often restless/fidgety with frequent interrupting and no verbal filter.  Some impulsive behavior was reported.  Ms. Janice stated that she struggles relating to peers, preferring adults or younger children.  She struggles with understanding jokes and sarcasm along with reading social-emotional cues.  She does not have any close relationships outside of her family.  Her current friends are online only.  Repetitive speech or behavior were denied, although Ms. Bessinger stated she will bring up the same topics repeatedly.  An overly intense interest in pets was reported, becomes  hyper-focused when she sees a dog or engages in simulation games.  She struggles adapting to change and transition and is hypersensitive to noise (car horns, yelling).     Procedures Administered: Alberta Almond Brief Intelligence Test 2 - Revised CNS Vital Signs Vineland Adaptive Behavior scales - 3 - Parent/Caregiver Form Behavior Rating Inventory for Executive Function - Adult Self-Report   Connors Adult ADHD Rating Scale 2 (CAARS-2) - Self Report  Adult OCD Inventory - Self Report Depression, Anxiety, and Stress Scale - Self Report Autism Diagnostic Observation Schedule 2 (ADOS 2) - Module 4 Social Responsiveness Scale 2 - Self and Other Report  Behavioral Observations:  Ms. Coppock was cooperative and displayed good effort. Attention and concentration were adequate overall, although Ms. Franzen needed several questions to be repeated, and she missed many relatively easy questions or problems.  She processed information slowly leading to longer than expected test administration times.  Mood was euthymic with appropriate affect.  Ms. Kalmar was socially responsive and took some interest in the examiner but initiated little conversation.  The results appear representative of current functioning.   Ms. Brightbill was casually dressed and adequately groomed.  Brief mental status indicated typical general orientation and alertness.  General memory appeared well developed, while working memory was impaired.  Knowledge and insight were fair while judgement and impulse control were good.  Current hallucinations, delusions, and thoughts of self-harm were denied.    Test Results and Interpretation:   General Intellectual Functioning:  The K-BIT 2 was used to assess Ms. Marsolek's performance across two areas of cognitive ability. When interpreting these scores, it is important to view the results as a snapshot of current intellectual functioning. As measured by the K-BIT 2, Ms. Loree's Composite IQ score fell within the  average range when compared to same age peers (CIQ = 47).  Ms. Ramaswamy performance was inconsistent across the Primary Index Scores, as Verbal Comprehension (VCI = 84) was below average and lower than Perceptual Reasoning (PRI = 101, average).  The difference was statistically significant at the P <.01 level.  This indicates weaker language understanding with typical visual learning ability.  On individual subtests, Ms. Huseby performed within the age typical range for verbal knowledge with below typical inferential thinking (riddles), indicating a weakness with verbal abstract reasoning.  Visual pattern analysis (matrices) was average.  Overall, Ms. Zamor appears to have adequately developed visual reasoning skills but difficulty with higher  level language comprehension.  Carlos American Brief Intelligence Test - 2 Composite Score Summary  Composite Scores  Sum of Raw Scores Standard Score Percentile Rank 90% Confidence Interval Qualitative Description  Verbal Comprehension  VC  70            84  14  80-90  Below Average  Nonverbal Reasoning PR 37       101 53 96-106 Average  Composite IQ  FSIQ -         91 27 87-95 Average    K-BIT 2 Continued Domain Subtest Name  Total Raw Score Scaled Score Percentile Rank  Verbal Verbal Knowledge VK 44  8 25  Comprehension Riddles Ri 26  6   9    Attention and Processing:  The results of the CNS Vital Signs testing indicated low overall neurocognitive processing ability, at a level well below measured comprehension ability (average).  Regarding areas related to attention problems, sustained attention was average and simple attention was high average, while complex attention was low and cognitive flexibility and executive function were very low.  These are the domains most closely associated with attention deficits.  Motor/psychomotor speed was below average to low, with very low processing speed and reaction time, indicating well below typical thinking speed,  coordinated movement, and responsiveness on computerized measures.  Visual memory was average, as was verbal memory, indicating relatively equal ability to remember images and words.  However, working memory (used for multi-tasking and problems solving) was low.  The results suggest that Ms. Conway appears to have typical ability attending to simple activities for relatively long periods with poorly developed other processing abilities including shifting attention, attending systematically, and multitasking memory.  Thinking speed and response speed on computerized tasks were also poorly developed, which is consistent with Ms. Rivenbark taking longer than expected to complete other measures.  The validity scales indicated a valid profile for all measures.                                                          CNS Vital Signs   Domain Scores Standard Score Percentile VI** Above Average Low Average Low Very Low  Neurocognition Index (NCI) 73 4 Yes    X   Composite Memory 102 55 Yes  X     Verbal Memory 109 73 Yes  X     Visual Memory 95 37 Yes  X     Psychomotor Speed 70 2 Yes    X   Reaction Time* 65 1 Yes     X  Complex Attention* 75 5 Yes    X   Cognitive Flexibility 52 1 Yes     X  Processing Speed 64 1 Yes     X  Executive Function 51 1 Yes     X  Social Acuity 103 58 Yes  X     Working Memory 77 6 Yes    X   Sustained Attention 90 25 Yes  X     Simple Attention 112 79 Yes X      Motor Speed 82 12 Yes   X        ** Validity Indicator  Adaptive Behavior & Independent Skills: The Vineland-3 is a standardized measure of adaptive behavior--the things that people do to function in their  everyday lives. Whereas ability measures focus on what the examinee can do in a testing situation, the Vineland-3 focuses on what they actually do in daily life. Because it is a norm-based instrument, the examinee's adaptive functioning is compared to that of others their age.  Ms. Cotrell was evaluated using the  Vineland -3 Comprehensive Parent/Caregiver Form on 11/20/2023. Sonie C. Christell Constant, Georgie's mother, completed the form.  Naleah's overall level of adaptive functioning is described by her score on the Adaptive Behavior Composite (ABC). Her ABC score is 67, which is well below the normative mean of 100 (the normative standard deviation is 15). The percentile rank for this overall score is 1.  The ABC score is based on scores for three specific adaptive behavior domains: Communication, Daily Living Skills, and Socialization. The domain scores are also expressed as standard scores with a mean of 100 and standard deviation of 15. The Communication domain measures how well Lysbeth listens and understands, expresses herself through speech, and reads and writes. Her Communication standard score is 74. This corresponds to a percentile rank of 4. This domain is a relative strength for [place]. The Daily Living Skills domain assesses Paloma's performance of the practical, everyday tasks of living that are appropriate for her age. Her standard score for Daily Living Skills is 75, which corresponds to a percentile rank of 5. This domain is a relative strength for [place].  Kaleeah's score for the Socialization domain reflects her functioning in social situations. Her Socialization standard score is 52. The percentile rank is <1. This domain is a relative weakness for her.  Vineland Adaptive Behavior Scales 3  ABC and Domain Score Summary   Subdomain Score Summary  The Maladaptive Behavior domain provides a brief assessment of problem behaviors. The additional information it provides can prove helpful in diagnosis or intervention planning. It may also be used as a screener to determine if a more in-depth assessment of problematic behavior is warranted.  The domain includes brief scales measuring Internalizing (i.e., emotional) and Externalizing (i.e., acting-out) problems. These scales are reported using v-scale scores, which  are scaled to a mean of 15 and standard deviation of 3. Higher Internalizing and Externalizing v-scale scores indicate more problem behavior. If qualitative descriptors are desired, scores of 1 to 17 may be considered Average, 18 to 20 Elevated, and 21 to 24 Clinically Significant.  Lysandra received v-scale scores of 24 for Internalizing and 19 for Externalizing.  The Maladaptive Behavior domain also includes a set of Critical Items covering more severe maladaptive behaviors. Because the Critical Items do not form a unified construct, they are not scored as a scale, but instead are reported at the item level. The Critical Items for which Jodell received a score of 2 (Often) or 1 (Sometimes) are listed below.  Loses awareness of what is happening around her (Sometimes) Repeats physical movements over and over (Often) Engages in compulsive behavior (Sometimes) Gets so fixated on a topic that it annoys others (Often)  Executive Function:  Ms. Lumb completed the Self-Report Form of the Behavior Rating Inventory of Executive Function, Second Edition-Adult Version (BRIEF2A) on 11/18/2023. There are no missing item responses in the protocol.  Responses were reasonably consistent.  Ms. Klemmer ratings of herself do not appear overly negative. There were no atypical responses to infrequently endorsed items. In the context of these validity considerations, ratings of Ms. Zeimet's executive function exhibited in everyday behavior indicate some areas of concern.  The overall index score, the GEC, was highly elevated (  GEC T = 85, %ile = >99). The Behavior Regulation Index (BRI), Emotion Regulation Index (ERI), and Cognitive Regulation Index (CRI) scores were all elevated (BRI T = 81, %ile = >99; ERI T = 91, %ile = >99, CRI T = 80, %ile = >99), suggesting self-regulatory problems in multiple domains.  Within these summary indicators, all of the individual scales can be calculated. One or more of the individual BRIEF2A  scale T scores were elevated, suggesting that Ms. Longan exhibits difficulty with some aspects of executive function. Concerns are noted with her ability to resist impulses, be aware of her functioning in social settings, adjust well to changes, react to events appropriately, get going on tasks and activities and independently generate ideas, sustain working memory, plan and organize her approach to problem solving appropriately, be appropriately cautious in her approach to tasks and check for mistakes and keep materials and belongings reasonably well-organized.  Ms. Kopke's scores on the Shift and Emotional Control scales are elevated. This profile suggests significant problem-solving rigidity combined with emotional dysregulation. Individuals with this profile tend to lose emotional control when their routines or perspectives are challenged or when flexibility is required.  Additionally, Ms. Ask's elevated scores on scales reflecting problems with fundamental behavioral and/or emotional regulation (Inhibit, Emotional Control, and Shift) suggest that more global problems with self-regulation are having a negative effect on problem solving.   BRIEF-A Score Summary Table Scale/Index/Composite Raw score T score Percentile 90% CI  Inhibit 19 75 99 67-83  Self-Monitor 16 83 >99 75-91  Behavior Regulation Index (BRI) 35 81 >99 75-87  Shift 17 82 >99 75-89  Emotional Control 24 82 >99 77-87  Emotion Regulation Index (ERI) 41 91 >99 86-96  Initiate 21 75 99 68-82  Working Memory 23 83 >99 77-89  BRIEF 2A - Continued      Plan/Organize 19 71 98 65-77  Task-Monitor 14 68 98 60-76  Organization of Materials 21 76 99 70-82  Cognitive Regulation Index (CRI) 98 80 >99 76-84  Global Executive Composite (GEC) 174 85 >99 82-88   Behavior and Social-Emotional Functioning: Ratings of attention and behavioral functioning by Ms. Wisser indicated much overall attention and behavior regulation difficulty at a  clinically significant level.  On the CAARS 2 Self Report, Ms. Averhart positively endorsed, as occurring often or very often, 6 of 9 items for inattention/poor organization and 4 of 9 items for hyperactivity and poor impulse control.  Endorsement of at least 5 items in either category is considered at-risk for ADHD.  T-scores indicated very high elevations in activity level and emotion regulation with mildly high inattention/executive dysfunction and impulse control. Self-concept was rated as appropriately developed.  CAARS 2 Scales - Self Report  Content Scales                                                T-score       90% CI          Percentile   Guideline               Elevated Items                             Inattention/ Executive Dysfunction 61 58-64 87th Slightly Elevated 11/30  Hyperactivity 73 68-78 97th Very Elevated 9/13  Impulsivity  62 57-67 85th Slightly Elevated 3/13  Emotional Dysregulation 80 75-85 99th Very Elevated 9/9  Negative Self-Concept 39 34-44 15th Not Elevated 0/7    T-score 90% CI Percentile Guideline Symptom Count ?  ADHD Inattentive Symptoms 64 60-68 90th Slightly Elevated 6/9  ADHD Hyperactive/ Impulsive Symptoms 67 62-72 95th Elevated 4/9  Total ADHD Symptoms 66 61-71 92nd Elevated n/a  Behavior and Social Emotional Functioning:  Self-report measures indicated much overall emotional distress.  Ms. Ricco responses on the Adult OCD Inventory indicated a clinically significant level of obsessive-compulsive tendencies, with her total score falling within the 'Moderate' range.  Thirteen of the 20 individual items were highly endorsed (occurring often or very often).  Additionally, Ms. Fera's scores on the Depression, Anxiety, and Stress Scale indicated high scores for depression with very high scores for anxiety and stress.  Nineteen of the 21 items were rated as occurring frequently.  Regarding symptoms of ASD, information from the ADOS 2 Module 4 indicated  difficulty with some aspects of social communication and reciprocal social interaction, with no instances of restricted-repetitive behavior observed during this administration.  Her overall severity score indicated a low likelihood of ASD, although adults often mask atypical behavior during these observations.  Within the area of communication, Ms. Monette spoke in complete sentences.  There was typical variation in her tone of voice while there was no observation of echolalia or repetitive speech.  She adequately reported routine and nonroutine events and frequently offered spontaneous personal information.  Ms. Spraggins responded adequately to personal information from the examiner with related comments and occasionally asked socially related questions.  Reciprocal conversation appeared typically developed for her age and cognitive level.  She demonstrated limited use of descriptive gestures (only doing so when asked), with more spontaneous use emphatic gestures.  Socially, Ms. Buczynski could adequately establish and maintain eye contact.  Her range of facial expression seemed limited, but she frequently directed facial expression to the examiner.  Her expression of enjoyment in interaction appeared typical during multiple conversations or activities (storytelling and pets).  Ms. Echavarria described personal emotions well but struggled spontaneously identifying emotions in others during pictures or other activities.  Ms. Heinle demonstrated adequate insight into the nature of social relationships, including describing her own role in the few relationships she has.  Her engagement in adult independent activities seemed well below age and IQ level.  Ms. Mcglynn did not initiate interaction often but the few times she did were socially appropriate.  She was limited with responsiveness to social advances from the examiner during most occasions.  Social rapport was difficult to maintain as her responses seemed scripted or stilted at  times.  Ms. Peabody demonstrated literal object use but adequate storytelling.  Regarding behavior, Ms. Langbehn did not demonstrate any sensory seeking behavior, odd movement, or self-injury.  Compulsive behavior was not observed, and Ms. Bassin did not discuss her interests excessively or out of context.    Social Responsiveness Scale - 2 - Self Report                                Awr             Cog             Com           Mot            RRB Raw  15                32                42              24               25                             T-score                    75                93                78              77               82  Awr = Social Awareness    Com = Social Museum/gallery exhibitions officer = Social Cognition     Mot = Social Motivation  RRB = Restricted Interests and Repetitive Behavior  DSM-5 Compatible Subscales Raw score T-score  Social Communication and Interaction        113   84    Restricted Interests and Repetitive Behavior         25   82   Ms. Jehle completed the Social Responsiveness Scale - 2 (SRS-2) regarding her behavior.  The SRS-2 measures behavior related to Autism Spectrum Disorder (ASD) regarding social interaction with one area relating to restricted repetitive behavior.  On this measure, the T Score of 84 was in the Severe range. Scores in this range indicate deficiencies in reciprocal social behavior that are clinically significant and lead to substantial interference with everyday social interactions. Such scores are typical for individuals with autism spectrum disorders of high severity.  Social communication and restricted repetitive behavior were both rated within the severe range.    Social Responsiveness Scale - 2 - Other Report                                Awr             Cog             Com           Mot            RRB Raw                         11                29                44               21              20                              T-score                    64                88  80               72              73  Awr = Social Awareness    Com = Social Museum/gallery exhibitions officer = Social Cognition     Mot = Social Motivation    RRB = Restricted Interests and Repetitive Behavior  DSM-5 Compatible Subscales Raw score T-score  Social Communication and Interaction        105   81    Restricted Interests and Repetitive Behavior         20   73   Ms. Moradi's mother, Hetvi Shawhan, also completed the Social Responsiveness Scale - 2 (SRS-2) regarding her daughter's behavior.  On this measure, the T Score of 80 was in the Severe range. Scores in this range indicate deficiencies in reciprocal social behavior that are clinically significant and lead to substantial interference with everyday social interactions. Such scores are typical for individuals with autism spectrum disorders of high severity.  Social communication was rated as severely elevated while restricted repetitive behavior was rated within the Moderately elevated range.    Summary: Ms. Maves was evaluated during March - April 2025 related to social interaction, anxiety, and independent skill deficits.  Ms. Patnode presents with a history of social interaction and cognitive processing deficits resulting in social and learning difficulty during childhood along with delayed independence as an adult.  She has been previously diagnosed with ADHD, along with anxiety and depressive disorders, but Autism Spectrum Disorder (ASD) was suspected due to sensory hypersensitivity, resistance to change, and overly intense interests along with the social interaction difficulty.  Testing was recommended to evaluate for ASD along with other conditions that may be affecting social interaction and behavior.  Test results indicated below average overall comprehension with better developed Nonverbal Reasoning (average) than Verbal Comprehension (below average).  Testing for  neurocognitive processing indicated low overall functioning with low to very low functioning regarding attention for complex activities, including cognitive flexibility, and executive function.  Other processing measures were also significantly impaired, including motor speed, processing speed, and working memory.  Adaptive behavior/independent skills were rated as very low by Ms. Grether's mother on the [place] 3 with low communication and daily living skills and very low socialization.  Self-report ratings for execution function (BRIEF 2A) indicated severe impairment overall with highly elevated scores regarding behavior, emotion, and thought regulation.  Ratings regarding ADHD related behavior functioning by Ms. Cuadrado indicated clinically mild impairment regarding attention and organization deficits and impulsivity, while hyperactivity and emotion dysregulation were rated as severely elevated, suggesting more of a mood regulation than attention disorder.  This is consistent with testing showing well developed simple attention along with a reported poor response to stimulant medication (Vyvanse).  Self-report ratings indicated much emotional distress including moderate or higher levels of obsessive-compulsive behavior, anxiety, depression, and stress.  While observation of ASD related behavior (ADOS 2) indicated just mild social interaction difficulty without restricted repetitive behavior, self-report and parent ratings of these areas indicated severe impairment regarding social and behavioral functioning.  The results meet the criteria for ASD based on direct testing, ratings, and developmental history, along with recurrent depression and generalized anxiety.  Ms. Jaskulski does not appear to have ADHD as her behavior patterns and medication history suggest more of a mood regulation disorder, although it could not be determined whether mood swings were triggered by environmental events or cyclical.  Additionally,  while ratings for OCD  were high, compulsive behavior was not observed or reported during initial interview.  Recommendations include discussing results with primary care physician, continuing individual counseling, and accessing appropriate vocational and community supports.  See below for recommendations.       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  Stimulant medication to address attention deficits is not recommended as problems appears more mood and executive function related.  Medication for anxiety/depressed mood is recommended to continue.  While there is no specific medication for ASD medications can help with mood and thought regulation of needed.   Individual counseling is recommended to continue help Ms. Bassette learn approaches to manage her anxiety and depressed mood, while understanding more about compensations strategies for ASD and executive function deficits.  Emphasis is on helping Ms. Hoots learn social and independent skills in a graduated manner that prevents her from becoming overwhelmed.  [facility] provides an online therapy group specifically geared toward adults with ASD.    Mental alertness/energy can also be raised by increasing exercise, improving sleep, eating a healthy diet, and managing depression/stress.  Consult with a physician regarding any changes to physical regimen.      It is also recommended that Ms. Handel's family receive training in Positive Behavior Support techniques to help them set up Ms. Degroat's environment for success and learn strategies to help promote calming and independence at home. Meet with Office of Employment for Individuals with Disabilities (Vocational Rehabilitation) to help with interviewing, resume  building, job training and support. Socially, Ms. Clauson would benefit from participation in structured social activity like clubs or interest groups that are small and are supervised along with having clear rules, guidelines and activities.  The [employer] [place] , the [employer], and [employer] provide local social support services for young adults.    Milon Aloe Demari Gales, Ph.D. Licensed Psychologist- HSP-P [phone]               Sherrelle Prochazka, PhD

## 2024-01-04 ENCOUNTER — Ambulatory Visit: Admitting: Psychology

## 2024-01-04 ENCOUNTER — Encounter: Payer: Self-pay | Admitting: Psychology

## 2024-01-04 DIAGNOSIS — F411 Generalized anxiety disorder: Secondary | ICD-10-CM

## 2024-01-04 DIAGNOSIS — F840 Autistic disorder: Secondary | ICD-10-CM | POA: Diagnosis not present

## 2024-01-04 DIAGNOSIS — F331 Major depressive disorder, recurrent, moderate: Secondary | ICD-10-CM

## 2024-01-04 NOTE — Progress Notes (Signed)
 [facility] Counselor/Therapist Progress Note  Patient ID: Carolyn Aguilar, MRN: [id number],    Date: 01/04/2024  Time Spent: 4:00 - 4:50 pm   Treatment Type: Individual Therapy  Reported Symptoms: Patient presents with a history of social interaction and cognitive processing deficits resulting in social and learning difficulty during childhood along with delayed independence as an adult. Patient has been previously diagnosed with ADHD, along with anxiety and depressive disorders, but Autism spectrum disorder (ASD) was indicated during recent psychological evaluation by this provider (April 2025).   Mental Status Exam: Appearance:  Casual and Neatly Groomed     Behavior: Appropriate  Motor: Normal  Speech/Language:  Clear and Coherent and Normal Rate  Affect: Restricted  Mood: euthymic  Thought process: normal  Thought content:   WNL  Sensory/Perceptual disturbances:   WNL  Orientation: oriented to person, place, time/date, and situation  Attention: Good  Concentration: Fair  Memory: WNL  Fund of knowledge:  Fair  Insight:   Fair  Judgment:  Good  Impulse Control: Good    Risk Assessment: Danger to Self:  No Self-injurious Behavior: No Danger to Others: No Duty to Warn:no Physical Aggression / Violence:No   Subjective: Patient stated that her goals for therapy were to learn to develop more happiness for herself, not feel jealous of others, and develop more adult independent behaviors.  She reported specific instances where she was having trouble feeling happy for her brother who will be getting married soon, along with anxiety about trying to find a job.        Interventions: Cognitive Behavioral Therapy, Psychologist, occupational, Building services engineer, and [employer], Executive function training  Diagnosis:Major depressive disorder, recurrent episode, moderate (HCC)  Generalized anxiety disorder  Autism spectrum disorder  Plan: Individual counseling is  recommended to continue help Ms. Balthazar learn approaches to manage her anxiety and depressed mood, while understanding more about compensations strategies for ASD and executive function deficits. Emphasis is on helping Ms. Hack learn social and independent skills in a graduated manner that prevents her from becoming overwhelmed.     Goal Enhance ability to effectively cope with the full variety of life's anxieties.  Objective Using coping skills to stay calm while making decisions. Target Date: 2025-01-15 Frequency: Biweekly  Progress: 05 Modality: individual   Goal Improve self esteem by increasing positive self statements.   Objective Make at one positive self statement daily. Target Date: 2025-01-15 Frequency: Biweekly  Progress: 05 Modality: individual   Carolyn Jamerson, PhD

## 2024-01-04 NOTE — Progress Notes (Signed)
   Bryson Dames, PhD

## 2024-01-15 DIAGNOSIS — F320 Major depressive disorder, single episode, mild: Secondary | ICD-10-CM | POA: Diagnosis not present

## 2024-01-15 DIAGNOSIS — R7303 Prediabetes: Secondary | ICD-10-CM | POA: Diagnosis not present

## 2024-01-15 DIAGNOSIS — F840 Autistic disorder: Secondary | ICD-10-CM | POA: Diagnosis not present

## 2024-01-15 DIAGNOSIS — F419 Anxiety disorder, unspecified: Secondary | ICD-10-CM | POA: Diagnosis not present

## 2024-01-15 DIAGNOSIS — E559 Vitamin D deficiency, unspecified: Secondary | ICD-10-CM | POA: Diagnosis not present

## 2024-01-15 DIAGNOSIS — F902 Attention-deficit hyperactivity disorder, combined type: Secondary | ICD-10-CM | POA: Diagnosis not present

## 2024-01-19 ENCOUNTER — Ambulatory Visit (INDEPENDENT_AMBULATORY_CARE_PROVIDER_SITE_OTHER): Admitting: Psychology

## 2024-01-19 ENCOUNTER — Encounter: Payer: Self-pay | Admitting: Psychology

## 2024-01-19 DIAGNOSIS — F411 Generalized anxiety disorder: Secondary | ICD-10-CM | POA: Diagnosis not present

## 2024-01-19 DIAGNOSIS — F840 Autistic disorder: Secondary | ICD-10-CM

## 2024-01-19 DIAGNOSIS — F331 Major depressive disorder, recurrent, moderate: Secondary | ICD-10-CM | POA: Diagnosis not present

## 2024-01-19 NOTE — Progress Notes (Signed)
 [facility] Counselor/Therapist Progress Note  Patient ID: Carolyn Aguilar, MRN: [id number],    Date: 01/19/2024  Time Spent: 10:30 - 11:20 am   Treatment Type: Individual Therapy Met with patient for therapy session.  Patient was at the clinic and session was conducted from therapist's office in person.    Reported Symptoms: Patient presents with a history of social interaction and cognitive processing deficits resulting in social and learning difficulty during childhood along with delayed independence as an adult. Patient has been previously diagnosed with ADHD, along with anxiety and depressive disorders, but Autism spectrum disorder (ASD) was indicated during recent psychological evaluation by this provider (April 2025).  Current concerns include being able to maintain self care and household responsibilities.    Mental Status Exam: Appearance:  Casual and Neatly Groomed     Behavior: Appropriate  Motor: Normal  Speech/Language:  Clear and Coherent and Normal Rate  Affect: Restricted  Mood: euthymic  Thought process: normal  Thought content:   WNL  Sensory/Perceptual disturbances:   WNL  Orientation: oriented to person, place, time/date, and situation  Attention: Good  Concentration: Fair  Memory: WNL  Fund of knowledge:  Fair  Insight:   Fair  Judgment:  Good  Impulse Control: Good    Risk Assessment: Danger to Self:  No Self-injurious Behavior: No Danger to Others: No Duty to Warn:no Physical Aggression / Violence:No   Subjective: Patient stated that visited her PCP who changed her medication regimen, replacing amitriptyline with Adderall.  She also had her initial interview with vocational rehabilitation and is waiting for them to provide her with a placement for training.  She is looking forward to a cruise that will be occurring in /August, but is most currently concerned about being able to eat healthier and having to take on more household responsibility  with her brother moving out of the home.           Interventions: Cognitive Behavioral Therapy, Psychologist, occupational, Mindfulness Meditation, and [employer], Executive function training Emphasis was on being abel to take a pause a calm to be able to move past her immediate emotional reaction and take action based on  Long term benefit.    Diagnosis:Major depressive disorder, recurrent episode, moderate (HCC)  Generalized anxiety disorder  Autism spectrum disorder  Plan: Individual counseling is recommended to continue help Ms. Lyness learn approaches to manage her anxiety and depressed mood, while understanding more about compensations strategies for ASD and executive function deficits. Emphasis is on helping Ms. Reser learn social and independent skills in a graduated manner that prevents her from becoming overwhelmed.   Goal Enhance ability to effectively cope with the full variety of life's anxieties.  Objective Using coping skills to stay calm while making decisions. Target Date: 2025-01-15 Frequency: Biweekly  Progress: 15 Modality: individual   Goal Improve self esteem by increasing positive self statements.   Objective Make at one positive self statement daily. Target Date: 2025-01-15 Frequency: Biweekly  Progress: 10 Modality: individual   Estalee Mccandlish, PhD                  Frances Joynt, PhD

## 2024-01-28 DIAGNOSIS — L292 Pruritus vulvae: Secondary | ICD-10-CM | POA: Diagnosis not present

## 2024-01-28 DIAGNOSIS — L80 Vitiligo: Secondary | ICD-10-CM | POA: Diagnosis not present

## 2024-01-28 DIAGNOSIS — N898 Other specified noninflammatory disorders of vagina: Secondary | ICD-10-CM | POA: Diagnosis not present

## 2024-01-28 DIAGNOSIS — L900 Lichen sclerosus et atrophicus: Secondary | ICD-10-CM | POA: Diagnosis not present

## 2024-02-01 ENCOUNTER — Encounter: Payer: Self-pay | Admitting: Psychology

## 2024-02-01 ENCOUNTER — Ambulatory Visit (INDEPENDENT_AMBULATORY_CARE_PROVIDER_SITE_OTHER): Admitting: Psychology

## 2024-02-01 DIAGNOSIS — F411 Generalized anxiety disorder: Secondary | ICD-10-CM | POA: Diagnosis not present

## 2024-02-01 DIAGNOSIS — F840 Autistic disorder: Secondary | ICD-10-CM

## 2024-02-01 DIAGNOSIS — F331 Major depressive disorder, recurrent, moderate: Secondary | ICD-10-CM | POA: Diagnosis not present

## 2024-02-01 NOTE — Progress Notes (Signed)
 [facility] Counselor/Therapist Progress Note  Patient ID: Carolyn Aguilar, MRN: [id number],    Date: 02/01/2024  Time Spent: 4:25 - 4:55 pm   Treatment Type: Individual Therapy Met with patient for therapy session.  Patient was at the clinic and session was conducted from therapist's office in person.    Reported Symptoms: Patient presents with a history of social interaction and cognitive processing deficits resulting in social and learning difficulty during childhood along with delayed independence as an adult. Patient has been previously diagnosed with ADHD, along with anxiety and depressive disorders, but Autism spectrum disorder (ASD) was indicated during recent psychological evaluation by this provider (April 2025).  Current concerns include being able to maintain eat healthy and exercise, along with coping with the end of an online relationship.    Mental Status Exam: Appearance:  Casual and Neatly Groomed     Behavior: Appropriate  Motor: Normal  Speech/Language:  Clear and Coherent and Normal Rate  Affect: Restricted  Mood: euthymic  Thought process: normal  Thought content:   WNL  Sensory/Perceptual disturbances:   WNL  Orientation: oriented to person, place, time/date, and situation  Attention: Good  Concentration: Fair  Memory: WNL  Fund of knowledge:  Fair  Insight:   Fair  Judgment:  Good  Impulse Control: Good    Risk Assessment: Danger to Self:  No Self-injurious Behavior: No Danger to Others: No Duty to Warn:no Physical Aggression / Violence:No   Subjective: Patient stated that she seems to be responding well to the Adderall, although she does not feel like eating as much.  She still had not heard back from vocational rehabilitation and stated that she will call to follow up.  She enjoyed her brother's wedding, but expressed feeling sad after an online relationship ended.  Patient stated that they did not become angry, instead talking about with  her family before taking some time to cry.     Interventions: Cognitive Behavioral Therapy, Psychologist, occupational, Mindfulness Meditation, and [employer], Executive function training Emphasis was on noting whether negative thoughts were exaggerate, while thinking of positive aspects of her life and self.      Diagnosis:Major depressive disorder, recurrent episode, moderate (HCC)  Generalized anxiety disorder  Autism spectrum disorder  Plan: Individual counseling is recommended to continue help Carolyn Aguilar learn approaches to manage her anxiety and depressed mood, while understanding more about compensations strategies for ASD and executive function deficits. Emphasis is on helping Carolyn Aguilar learn social and independent skills in a graduated manner that prevents her from becoming overwhelmed.   Goal Enhance ability to effectively cope with the full variety of life's anxieties.  Objective Using coping skills to stay calm while making decisions. Target Date: 2025-01-15 Frequency: Biweekly  Progress: 25 Modality: individual   Goal Improve self esteem by increasing positive self statements.   Objective Make at least one positive self statement daily. Target Date: 2025-01-15 Frequency: Biweekly  Progress: 20 Modality: individual   Drayven Marchena, PhD                                 Eldwin Volkov, PhD

## 2024-02-16 DIAGNOSIS — R7303 Prediabetes: Secondary | ICD-10-CM | POA: Diagnosis not present

## 2024-02-16 DIAGNOSIS — F902 Attention-deficit hyperactivity disorder, combined type: Secondary | ICD-10-CM | POA: Diagnosis not present

## 2024-03-02 ENCOUNTER — Encounter: Payer: Self-pay | Admitting: Psychology

## 2024-03-02 ENCOUNTER — Ambulatory Visit: Admitting: Psychology

## 2024-03-02 DIAGNOSIS — F411 Generalized anxiety disorder: Secondary | ICD-10-CM | POA: Diagnosis not present

## 2024-03-02 DIAGNOSIS — F840 Autistic disorder: Secondary | ICD-10-CM

## 2024-03-02 NOTE — Progress Notes (Signed)
 [facility] Counselor/Therapist Progress Note  Patient ID: Carolyn Aguilar, MRN: [id number],    Date: 03/02/2024  Time Spent: 4:10 - 4:50 pm   Treatment Type: Individual Therapy Met with patient for therapy session.  Patient was at the clinic and session was conducted from therapist's office in person.    Reported Symptoms: Patient presents with a history of social interaction and cognitive processing deficits resulting in social and learning difficulty during childhood along with delayed independence as an adult. Patient has been previously diagnosed with ADHD, along with anxiety and depressive disorders, but Autism spectrum disorder (ASD) was indicated during recent psychological evaluation by this provider (April 2025).  Current concerns include being able to maintain eat healthy and exercise, along with worries about her family when she takes an upcoming trip.    Mental Status Exam: Appearance:  Casual and Neatly Groomed     Behavior: Appropriate  Motor: Normal  Speech/Language:  Clear and Coherent and Normal Rate  Affect: Restricted  Mood: euthymic  Thought process: normal  Thought content:   WNL  Sensory/Perceptual disturbances:   WNL  Orientation: oriented to person, place, time/date, and situation  Attention: Good  Concentration: Fair  Memory: WNL  Fund of knowledge:  Fair  Insight:   Fair  Judgment:  Good  Impulse Control: Good    Risk Assessment: Danger to Self:  No Self-injurious Behavior: No Danger to Others: No Duty to Warn:no Physical Aggression / Violence:No   Subjective: Patient stated that she has been paying more attention to her health, primarily focusing on eating healthy and exercising. She struggles with maintaining a consistent sleep schedule and having energy during the day.  She still had not heard back from vocational rehabilitation and wrote down a note to call to follow up during the session.  She reported meeting another person online,  but this one was local and she went out with him and her parents on a date. Patient is currently watching her cousins at home while working on getting her drivers license and finding a job.  She is looking forward to going on a cruise with her family but worries about harm coming to her 20 year old nephew.    Interventions: Cognitive Behavioral Therapy, Psychologist, occupational, Mindfulness Meditation, and [employer], Executive function training Emphasis was on thinking using problem solving techniques when she worries about legitimate concerns.        Assessment: Patient appears more positive about her life and future prospects, although she is still hesitant to transition in to adulthood.    Diagnosis:Generalized anxiety disorder  Autism spectrum disorder  Plan: Individual counseling is recommended to continue help Ms. Cotterill learn approaches to manage her anxiety and depressed mood, while understanding more about compensations strategies for ASD and executive function deficits. Emphasis is on helping Ms. Petrizzo learn social and independent skills in a graduated manner that prevents her from becoming overwhelmed.   Goal Enhance ability to effectively cope with the full variety of life's anxieties.  Objective Using coping skills to stay calm while making decisions. Target Date: 2025-01-15 Frequency: Biweekly  Progress: 30 Modality: individual   Goal Improve self esteem by increasing positive self statements.   Objective Make at least one positive self statement daily. Target Date: 2025-01-15 Frequency: Biweekly  Progress: 30 Modality: individual   Shamekia Tippets, PhD  Davaughn Hillyard, PhD

## 2024-03-15 ENCOUNTER — Ambulatory Visit: Admitting: Allergy & Immunology

## 2024-03-23 ENCOUNTER — Ambulatory Visit: Admitting: Psychology

## 2024-04-07 ENCOUNTER — Encounter: Payer: Self-pay | Admitting: Allergy & Immunology

## 2024-04-07 ENCOUNTER — Other Ambulatory Visit: Payer: Self-pay

## 2024-04-07 ENCOUNTER — Ambulatory Visit: Admitting: Allergy & Immunology

## 2024-04-07 VITALS — BP 90/68 | HR 92 | Temp 97.6°F | Resp 20 | Ht 61.0 in | Wt 245.5 lb

## 2024-04-07 DIAGNOSIS — L2089 Other atopic dermatitis: Secondary | ICD-10-CM | POA: Diagnosis not present

## 2024-04-07 DIAGNOSIS — J4540 Moderate persistent asthma, uncomplicated: Secondary | ICD-10-CM

## 2024-04-07 DIAGNOSIS — J45998 Other asthma: Secondary | ICD-10-CM | POA: Diagnosis not present

## 2024-04-07 DIAGNOSIS — J45909 Unspecified asthma, uncomplicated: Secondary | ICD-10-CM | POA: Diagnosis not present

## 2024-04-07 DIAGNOSIS — J310 Chronic rhinitis: Secondary | ICD-10-CM | POA: Diagnosis not present

## 2024-04-07 MED ORDER — BUDESONIDE-FORMOTEROL FUMARATE 160-4.5 MCG/ACT IN AERO: 2.0000 | INHALATION_SPRAY | Freq: Two times a day (BID) | RESPIRATORY_TRACT | 5 refills | Status: AC

## 2024-04-07 MED ORDER — TRIAMCINOLONE ACETONIDE 0.1 % EX OINT: 1.0000 | TOPICAL_OINTMENT | Freq: Two times a day (BID) | CUTANEOUS | 3 refills | Status: AC

## 2024-04-07 NOTE — Progress Notes (Unsigned)
 NEW PATIENT  Date of Service/Encounter:  04/07/24  Consult requested by: Alben Therisa MATSU, PA   Assessment:   Moderate persistent asthma, uncomplicated  Chronic rhinitis  Flexural atopic dermatitis  Plan/Recommendations:   Patient Instructions  1. Moderate persistent asthma, uncomplicated - Lung testing looked low today. - It did improve with the albuterol  treatment markedly so. - Because of this, we are going to start a daily controller medication that contains a long acting albuterol  combined with an inhaled steroid.  - This will replace your budesonide .  - Spacer sample and demonstration provided. - Daily controller medication(s): Symbicort  160/4.21mcg two puffs twice daily with spacer - Prior to physical activity: albuterol  2 puffs 10-15 minutes before physical activity. - Rescue medications: albuterol  4 puffs every 4-6 hours as needed - Changes during respiratory infections or worsening symptoms: Add on budesonide  nebulizer one treatment twice daily for 1-2 WEEKS - Asthma control goals:  * Full participation in all desired activities (may need albuterol  before activity) * Albuterol  use two time or less a week on average (not counting use with activity) * Cough interfering with sleep two time or less a month * Oral steroids no more than once a year * No hospitalizations  2. Chronic rhinitis - Because of insurance stipulations, we cannot do skin testing on the same day as your first visit. - We are all working to fight this, but for now we need to do two separate visits.  - We will know more after we do testing at the next visit.  - The skin testing visit can be squeezed in at your convenience.  - Then we can make a more full plan to address all of your symptoms. - Be sure to stop your antihistamines for 3 days before this appointment.  - We are going to continue with the levocetirizine and the montelukast  and the fluticasone  for now. - Stop the levocetirizine for 3  days before the next visit.  3. Flexural atopic dermatitis - Add on triamcinolone  0.1% ointment twice daily. - Continue with the moisturizing.  4. Return in about 1 week (around 04/14/2024) for ALLERGY TESTING (1-55). You can have the follow up appointment with Dr. Iva or a Nurse Practicioner (our Nurse Practitioners are excellent and always have Physician oversight!).    Please inform us  of any Emergency Department visits, hospitalizations, or changes in symptoms. Call us  before going to the ED for breathing or allergy symptoms since we might be able to fit you in for a sick visit. Feel free to contact us  anytime with any questions, problems, or concerns.  It was a pleasure to meet you and your family today!  Websites that have reliable patient information: 1. American Academy of Asthma, Allergy, and Immunology: www.aaaai.org 2. Food Allergy Research and Education (FARE): foodallergy.org 3. Mothers of Asthmatics: http://www.asthmacommunitynetwork.org 4. American College of Allergy, Asthma, and Immunology: www.acaai.org      "Like" us  on Facebook and Instagram for our latest updates!      A healthy democracy works best when [employer] participate! Make sure you are registered to vote! If you have moved or changed any of your contact information, you will need to get this updated before voting! Scan the QR codes below to learn more!              {Blank single:19197::This note in its entirety was forwarded to the Provider who requested this consultation.}  Subjective:   Carolyn Aguilar is a 18 y.o. female presenting today  for evaluation of  Chief Complaint  Patient presents with  . Allergic Rhinitis     Has headaches, memory loss, and ear pain.   . Asthma    Some shortness of breath, and cough.   . Eczema    Lots of itching everywhere. Some vaginal itching over the past few months.     Carolyn Aguilar has a history of the following: Patient Active Problem  List   Diagnosis Date Noted  . Asthma with status asthmaticus 05/01/2012  . Hypoxemia 05/01/2012    History obtained from: chart review and {Persons; PED relatives w/patient:19415::patient}.  Discussed the use of AI scribe software for clinical note transcription with the patient and/or guardian, who gave verbal consent to proceed.  Carolyn Aguilar was referred by Alben Therisa MATSU, PA.     Carolyn Aguilar is a 18 y.o. female presenting for {Blank single:19197::a food challenge,a drug challenge,skin testing,a sick visit,an evaluation of ***,a follow up visit}.    Asthma/Respiratory Symptom History: ***  Allergic Rhinitis Symptom History: ***  Food Allergy Symptom History: ***  Skin Symptom History: ***  GERD Symptom History: She is on omeprazole and famotidine.   Infection Symptom History: ***  ***Otherwise, there is no history of other atopic diseases, including {Blank multiple:19196:o:asthma,food allergies,drug allergies,environmental allergies,stinging insect allergies,eczema,urticaria,contact dermatitis}. There is no significant infectious history. ***Vaccinations are up to date.    Past Medical History: Patient Active Problem List   Diagnosis Date Noted  . Asthma with status asthmaticus 05/01/2012  . Hypoxemia 05/01/2012    Medication List:  Allergies as of 04/07/2024   No Known Allergies      Medication List        Accurate as of April 07, 2024  4:19 PM. If you have any questions, ask your nurse or doctor.          STOP taking these medications    azithromycin  250 MG tablet Commonly known as: ZITHROMAX  Stopped by: Marty Morton Shaggy   beclomethasone 40 MCG/ACT inhaler Commonly known as: QVAR  Stopped by: Marty Morton Shaggy   cetirizine  10 MG tablet Commonly known as: ZYRTEC  Stopped by: Marty Morton Shaggy       TAKE these medications    albuterol  108 (90 Base) MCG/ACT inhaler Commonly known as: VENTOLIN  HFA Inhale 2  puffs into the lungs every 6 (six) hours as needed. For shortness of breath   albuterol  (2.5 MG/3ML) 0.083% nebulizer solution Commonly known as: PROVENTIL  Take 3 mLs (2.5 mg total) by nebulization every 6 (six) hours as needed for wheezing or shortness of breath.   amphetamine-dextroamphetamine 10 MG 24 hr capsule Commonly known as: ADDERALL XR Take 10 mg by mouth every morning.   budesonide  0.5 MG/2ML nebulizer solution Commonly known as: PULMICORT  Take by nebulization daily.   budesonide -formoterol  160-4.5 MCG/ACT inhaler Commonly known as: Symbicort  Inhale 2 puffs into the lungs in the morning and at bedtime. Started by: Marty Morton Shaggy   clobetasol cream 0.05 % Commonly known as: TEMOVATE Apply topically.   famotidine 40 MG tablet Commonly known as: PEPCID Take 40 mg by mouth daily.   fluticasone  50 MCG/ACT nasal spray Commonly known as: FLONASE  Place 2 sprays into both nostrils daily.   hydrOXYzine  25 MG capsule Commonly known as: Vistaril  Take 1 capsule (25 mg total) by mouth at bedtime and may repeat dose one time if needed.   hyoscyamine 0.125 MG tablet Commonly known as: LEVSIN Take by mouth every morning.   IRON PO Take by mouth.  Junel FE 1.5/30 1.5-30 MG-MCG tablet Generic drug: norethindrone-ethinyl estradiol-iron Take 1 tablet by mouth daily.   levocetirizine 5 MG tablet Commonly known as: XYZAL Take 5 mg by mouth every evening.   montelukast  10 MG tablet Commonly known as: SINGULAIR  Take 10 mg by mouth at bedtime.   omeprazole 40 MG capsule Commonly known as: PRILOSEC TAKE 1 CAPSULE BY MOUTH EVERY DAY 30 MINUTES BEFORE BREAKFAST   ondansetron  4 MG disintegrating tablet Commonly known as: ZOFRAN -ODT DISSOLVE 1 TABLET(4 MG) ON THE TONGUE EVERY 8 HOURS AS NEEDED   sertraline 100 MG tablet Commonly known as: ZOLOFT Take 100 mg by mouth daily.   topiramate  25 MG tablet Commonly known as: TOPAMAX  TAKE 1 TABLET(25 MG) BY MOUTH TWICE  DAILY   triamcinolone  ointment 0.1 % Commonly known as: KENALOG  Apply 1 Application topically 2 (two) times daily. Started by: Marty Morton Shaggy        Birth History: non-contributory  Developmental History: non-contributory  Past Surgical History: History reviewed. No pertinent surgical history.   Family History: Family History  Problem Relation Age of Onset  . Angioedema Mother   . Allergic rhinitis Mother   . Hypertension Mother   . Eczema Maternal Uncle   . Diabetes Paternal Grandfather   . Migraines Neg Hx      Social History: Zeffie lives at home with her mother. They live in a house that has wooden floors throughout. There is electric heating and window units for cooling.  There is 1 dog inside of the home.  There are no dust mite covers on the bedding.  There is no tobacco exposure.  There is no fume, chemical, or dust exposure.  They do not have a HEPA filter in the home.  [place] do not [place] an interstate or industrial area.   Review of systems otherwise negative other than that mentioned in the HPI.    Objective:   Blood pressure 90/68, pulse 92, temperature 97.6 F (36.4 C), resp. rate 20, height 5' 1 (1.549 m), weight 245 lb 8 oz (111.4 kg), SpO2 98%. Body mass index is 46.39 kg/m.     Physical Exam   Diagnostic studies:    Spirometry: results normal (FEV1: 1.52/59%, FVC: 2.08/71%, FEV1/FVC: 73%).    Spirometry consistent with moderate obstructive disease. Xopenex four puffs via MDI treatment given in clinic with significant improvement in FEV1 and FVC per ATS criteria.  Allergy Studies: deferred due to insurance stipulations that require a separate visit for testing          Marty Shaggy, MD [facility] 

## 2024-04-07 NOTE — Patient Instructions (Addendum)
 1. Moderate persistent asthma, uncomplicated - Lung testing looked low today. - It did improve with the albuterol  treatment markedly so. - Because of this, we are going to start a daily controller medication that contains a long acting albuterol  combined with an inhaled steroid.  - This will replace your budesonide .  - Spacer sample and demonstration provided. - Daily controller medication(s): Symbicort  160/4.29mcg two puffs twice daily with spacer - Prior to physical activity: albuterol  2 puffs 10-15 minutes before physical activity. - Rescue medications: albuterol  4 puffs every 4-6 hours as needed - Changes during respiratory infections or worsening symptoms: Add on budesonide  nebulizer one treatment twice daily for 1-2 WEEKS - Asthma control goals:  * Full participation in all desired activities (may need albuterol  before activity) * Albuterol  use two time or less a week on average (not counting use with activity) * Cough interfering with sleep two time or less a month * Oral steroids no more than once a year * No hospitalizations  2. Chronic rhinitis - Because of insurance stipulations, we cannot do skin testing on the same day as your first visit. - We are all working to fight this, but for now we need to do two separate visits.  - We will know more after we do testing at the next visit.  - The skin testing visit can be squeezed in at your convenience.  - Then we can make a more full plan to address all of your symptoms. - Be sure to stop your antihistamines for 3 days before this appointment.  - We are going to continue with the levocetirizine and the montelukast  and the fluticasone  for now. - Stop the levocetirizine for 3 days before the next visit.  3. Flexural atopic dermatitis - Add on triamcinolone  0.1% ointment twice daily. - Continue with the moisturizing.  4. Return in about 1 week (around 04/14/2024) for ALLERGY TESTING (1-55). You can have the follow up appointment with  Dr. Iva or a Nurse Practicioner (our Nurse Practitioners are excellent and always have Physician oversight!).    Please inform us  of any Emergency Department visits, hospitalizations, or changes in symptoms. Call us  before going to the ED for breathing or allergy symptoms since we might be able to fit you in for a sick visit. Feel free to contact us  anytime with any questions, problems, or concerns.  It was a pleasure to meet you and your family today!  Websites that have reliable patient information: 1. American Academy of Asthma, Allergy, and Immunology: www.aaaai.org 2. Food Allergy Research and Education (FARE): foodallergy.org 3. Mothers of Asthmatics: http://www.asthmacommunitynetwork.org 4. American College of Allergy, Asthma, and Immunology: www.acaai.org      "Like" us  on Facebook and Instagram for our latest updates!      A healthy democracy works best when [employer] participate! Make sure you are registered to vote! If you have moved or changed any of your contact information, you will need to get this updated before voting! Scan the QR codes below to learn more!

## 2024-04-08 ENCOUNTER — Encounter: Payer: Self-pay | Admitting: Allergy & Immunology

## 2024-04-14 ENCOUNTER — Ambulatory Visit: Admitting: Allergy & Immunology

## 2024-04-14 DIAGNOSIS — J309 Allergic rhinitis, unspecified: Secondary | ICD-10-CM

## 2024-04-19 ENCOUNTER — Ambulatory Visit (INDEPENDENT_AMBULATORY_CARE_PROVIDER_SITE_OTHER): Admitting: Allergy & Immunology

## 2024-04-19 DIAGNOSIS — J302 Other seasonal allergic rhinitis: Secondary | ICD-10-CM

## 2024-04-19 DIAGNOSIS — J3089 Other allergic rhinitis: Secondary | ICD-10-CM | POA: Diagnosis not present

## 2024-04-19 MED ORDER — MONTELUKAST SODIUM 10 MG PO TABS: 10.0000 mg | ORAL_TABLET | Freq: Every day | ORAL | 3 refills | Status: AC

## 2024-04-19 MED ORDER — LEVOCETIRIZINE DIHYDROCHLORIDE 5 MG PO TABS: 5.0000 mg | ORAL_TABLET | Freq: Every evening | ORAL | 3 refills | Status: AC

## 2024-04-19 NOTE — Patient Instructions (Addendum)
 1. Moderate persistent asthma, uncomplicated - Lung testing not done today.  - Spacer sample and demonstration provided. - Daily controller medication(s): Symbicort  160/4.62mcg two puffs twice daily with spacer - Prior to physical activity: albuterol  2 puffs 10-15 minutes before physical activity. - Rescue medications: albuterol  4 puffs every 4-6 hours as needed - Changes during respiratory infections or worsening symptoms: Add on budesonide  nebulizer one treatment twice daily for 1-2 WEEKS - Asthma control goals:  * Full participation in all desired activities (may need albuterol  before activity) * Albuterol  use two time or less a week on average (not counting use with activity) * Cough interfering with sleep two time or less a month * Oral steroids no more than once a year * No hospitalizations  2. Chronic rhinitis - Testing today showed: grasses, ragweed, weeds, trees, indoor molds, outdoor molds, dust mites, and dog - Copy of test results provided. - Avoidance measures provided. - Continue with: Singulair  (montelukast ) 10mg  daily and Flonase  (fluticasone ) two sprays per nostril daily (AIM FOR EAR ON EACH SIDE) - Start taking: Xyzal  (levocetirizine) 5mg  tablet once daily - You can use an extra dose of the antihistamine, if needed, for breakthrough symptoms.  - Consider nasal saline rinses 1-2 times daily to remove allergens from the nasal cavities as well as help with mucous clearance (this is especially helpful to do before the nasal sprays are given) - Strongly consider allergy shots as a means of long-term control. - Allergy shots re-train and reset the immune system to ignore environmental allergens and decrease the resulting immune response to those allergens (sneezing, itchy watery eyes, runny nose, nasal congestion, etc).    - Allergy shots improve symptoms in 75-85% of patients.  - We can discuss more at the next appointment if the medications are not working for you.  3.  Flexural atopic dermatitis - Continue with triamcinolone  0.1% ointment twice daily. - You can use this on your testing sites on your arm and back, too!  - Continue with the moisturizing.  4. Return in about 3 months (around 07/19/2024). You can have the follow up appointment with Dr. Iva or a Nurse Practicioner (our Nurse Practitioners are excellent and always have Physician oversight!).    Please inform us  of any Emergency Department visits, hospitalizations, or changes in symptoms. Call us  before going to the ED for breathing or allergy symptoms since we might be able to fit you in for a sick visit. Feel free to contact us  anytime with any questions, problems, or concerns.  It was a pleasure to meet you and your family today!  Websites that have reliable patient information: 1. American Academy of Asthma, Allergy, and Immunology: www.aaaai.org 2. Food Allergy Research and Education (FARE): foodallergy.org 3. Mothers of Asthmatics: http://www.asthmacommunitynetwork.org 4. American College of Allergy, Asthma, and Immunology: www.acaai.org      "Like" us  on Facebook and Instagram for our latest updates!      A healthy democracy works best when [employer] participate! Make sure you are registered to vote! If you have moved or changed any of your contact information, you will need to get this updated before voting! Scan the QR codes below to learn more!         Airborne Adult Perc - 04/19/24 1517     Time Antigen Placed 1517    Allergen Manufacturer Jestine    Location Back    Number of Test 55    1. Control-Buffer 50% Glycerol Negative    2. Control-Histamine 2+  3. Bahia Negative    4. [place] Negative    5. Johnson Negative    6. [place]  Blue 3+    7. Meadow Fescue 3+    8. Perennial Rye 3+    9. Timothy Negative    10. Ragweed Mix Negative    11. Cocklebur Negative    12. Plantain,  English Negative    13. Baccharis Negative    14. Dog Fennel Negative     15. Russian Thistle Negative    16. Lamb's Quarters Negative    17. Sheep Sorrell Negative    18. Rough Pigweed Negative    19. Marsh Elder, Rough Negative    20. Mugwort, Common 2+    21. Box, Elder 2+    22. Cedar, red 2+    23. Sweet Gum 2+    24. Pecan Pollen Negative    25. Pine Mix Negative    26. Walnut, Black Pollen 2+    27. Red Mulberry Negative    28. Ash Mix Negative    29. Birch Mix Negative    30. Beech American Negative    31. Cottonwood, [place] Negative    32. Hickory, White 3+    33. Maple Mix 3+    34. Oak, [place] Mix 3+    35. Sycamore Eastern 3+    36. Alternaria Alternata Negative    37. Cladosporium Herbarum Negative    38. Aspergillus Mix Negative    39. Penicillium Mix Negative    40. Bipolaris Sorokiniana (Helminthosporium) Negative    41. Drechslera Spicifera (Curvularia) Negative    42. Mucor Plumbeus Negative    43. Fusarium Moniliforme 3+    44. Aureobasidium Pullulans (pullulara) Negative    45. Rhizopus Oryzae Negative    46. Botrytis Cinera Negative    47. Epicoccum Nigrum Negative    48. Phoma Betae Negative    49. Dust Mite Mix 4+    50. Cat Hair 10,000 BAU/ml Negative    51.  Dog Epithelia Negative    52. Mixed Feathers Negative    53. Horse Epithelia Negative    54. Cockroach, German Negative    55. Tobacco Leaf Negative          Intradermal - 04/19/24 1610     Time Antigen Placed 1600    Allergen Manufacturer Jestine    Location Arm    Number of Test 11    Control Negative    Bahia 2+    [place] 2+    Johnson Negative    Ragweed Mix 3+    Mold 1 3+    Mold 2 3+    Mold 3 3+    Mold 4 Omitted    Cat Negative    Dog 3+    Cockroach Negative          Reducing Pollen Exposure  The American Academy of Allergy, Asthma and Immunology suggests the following steps to reduce your exposure to pollen during allergy seasons.    Do not hang sheets or clothing out to dry; pollen may collect on these items. Do not mow lawns  or spend time around freshly cut grass; mowing stirs up pollen. Keep windows closed at night.  Keep car windows closed while driving. Minimize morning activities outdoors, a time when pollen counts are usually at their highest. Stay indoors as much as possible when pollen counts or humidity is high and on windy days when pollen tends to remain in the air longer. Use air  conditioning when possible.  Many air conditioners have filters that trap the pollen spores. Use a HEPA room air filter to remove pollen form the indoor air you breathe.  Control of Mold Allergen   Mold and fungi can grow on a variety of surfaces provided certain temperature and moisture conditions exist.  Outdoor molds grow on plants, decaying vegetation and soil.  The major outdoor mold, Alternaria and Cladosporium, are found in very high numbers during hot and dry conditions.  Generally, a late Summer - Fall peak is seen for common outdoor fungal spores.  Rain will temporarily lower outdoor mold spore count, but counts rise rapidly when the rainy period ends.  The most important indoor molds are Aspergillus and Penicillium.  Dark, humid and poorly ventilated basements are ideal sites for mold growth.  The next most common sites of mold growth are the bathroom and the kitchen.  Outdoor (Seasonal) Mold Control   Use air conditioning and keep windows closed Avoid exposure to decaying vegetation. Avoid leaf raking. Avoid grain handling. Consider wearing a face mask if working in moldy areas.    Indoor (Perennial) Mold Control    Maintain humidity below 50%. Clean washable surfaces with 5% bleach solution. Remove sources e.g. contaminated carpets.    Control of Dust Mite Allergen    Dust mites play a major role in allergic asthma and rhinitis.  They occur in environments with high humidity wherever human skin is found.  Dust mites absorb humidity from the atmosphere (ie, they do not drink) and feed on organic matter  (including shed human and animal skin).  Dust mites are a microscopic type of insect that you cannot see with the naked eye.  High levels of dust mites have been detected from mattresses, pillows, carpets, upholstered furniture, bed covers, clothes, soft toys and any woven material.  The principal allergen of the dust mite is found in its feces.  A gram of dust may contain 1,000 mites and 250,000 fecal particles.  Mite antigen is easily measured in the air during house cleaning activities.  Dust mites do not bite and do not cause harm to humans, other than by triggering allergies/asthma.    Ways to decrease your exposure to dust mites in your home:  Encase mattresses, box springs and pillows with a mite-impermeable barrier or cover   Wash sheets, blankets and drapes weekly in hot water (130 F) with detergent and dry them in a dryer on the hot setting.  Have the room cleaned frequently with a vacuum cleaner and a damp dust-mop.  For carpeting or rugs, vacuuming with a vacuum cleaner equipped with a high-efficiency particulate air (HEPA) filter.  The dust mite allergic individual should not be in a room which is being cleaned and should wait 1 hour after cleaning before going into the room. Do not sleep on upholstered furniture (eg, couches).   If possible removing carpeting, upholstered furniture and drapery from the home is ideal.  Horizontal blinds should be eliminated in the rooms where the person spends the most time (bedroom, study, television room).  Washable vinyl, roller-type shades are optimal. Remove all non-washable stuffed toys from the bedroom.  Wash stuffed toys weekly like sheets and blankets above.   Reduce indoor humidity to less than 50%.  Inexpensive humidity monitors can be purchased at most hardware stores.  Do not use a humidifier as can make the problem worse and are not recommended.  Control of Dog or Cat Allergen  Avoidance is the best  way to manage a dog or cat allergy. If you  have a dog or cat and are allergic to dog or cats, consider removing the dog or cat from the home. If you have a dog or cat but don't want to find it a new home, or if your family wants a pet even though someone in the household is allergic, here are some strategies that may help keep symptoms at bay:  Keep the pet out of your bedroom and restrict it to only a few rooms. Be advised that keeping the dog or cat in only one room will not limit the allergens to that room. Don't pet, hug or kiss the dog or cat; if you do, wash your hands with soap and water. High-efficiency particulate air (HEPA) cleaners run continuously in a bedroom or living room can reduce allergen levels over time. Regular use of a high-efficiency vacuum cleaner or a central vacuum can reduce allergen levels. Giving your dog or cat a bath at least once a week can reduce airborne allergen.  Allergy Shots  Allergies are the result of a chain reaction that starts in the immune system. Your immune system controls how your body defends itself. For instance, if you have an allergy to pollen, your immune system identifies pollen as an invader or allergen. Your immune system overreacts by producing antibodies called Immunoglobulin E (IgE). These antibodies travel to cells that release chemicals, causing an allergic reaction.  The concept behind allergy immunotherapy, whether it is received in the form of shots or tablets, is that the immune system can be desensitized to specific allergens that trigger allergy symptoms. Although it requires time and patience, the payback can be long-term relief. Allergy injections contain a dilute solution of those substances that you are allergic to based upon your skin testing and allergy history.   How Do Allergy Shots Work?  Allergy shots work much like a vaccine. Your body responds to injected amounts of a particular allergen given in increasing doses, eventually developing a resistance and tolerance to  it. Allergy shots can lead to decreased, minimal or no allergy symptoms.  There generally are two phases: build-up and maintenance. Build-up often ranges from three to six months and involves receiving injections with increasing amounts of the allergens. The shots are typically given once or twice a week, though more rapid build-up schedules are sometimes used.  The maintenance phase begins when the most effective dose is reached. This dose is different for each person, depending on how allergic you are and your response to the build-up injections. Once the maintenance dose is reached, there are longer periods between injections, typically two to four weeks.  Occasionally doctors give cortisone-type shots that can temporarily reduce allergy symptoms. These types of shots are different and should not be confused with allergy immunotherapy shots.  Who Can Be Treated with Allergy Shots?  Allergy shots may be a good treatment approach for people with allergic rhinitis (hay fever), allergic asthma, conjunctivitis (eye allergy) or stinging insect allergy.   Before deciding to begin allergy shots, you should consider:   The length of allergy season and the severity of your symptoms  Whether medications and/or changes to your environment can control your symptoms  Your desire to avoid long-term medication use  Time: allergy immunotherapy requires a major time commitment  Cost: may vary depending on your insurance coverage  Allergy shots for children age 58 and older are effective and often well tolerated. They might prevent the onset of  new allergen sensitivities or the progression to asthma.  Allergy shots are not started on patients who are pregnant but can be continued on patients who become pregnant while receiving them. In some patients with other medical conditions or who take certain common medications, allergy shots may be of risk. It is important to mention other medications you talk to your  allergist.   What are the two types of build-ups offered:   RUSH or Rapid Desensitization -- one day of injections lasting from 8:30-4:30pm, injections every 1 hour.  Approximately half of the build-up process is completed in that one day.  The following week, normal build-up is resumed, and this entails ~16 visits either weekly or twice weekly, until reaching your "maintenance dose" which is continued weekly until eventually getting spaced out to every month for a duration of 3 to 5 years. The regular build-up appointments are nurse visits where the injections are administered, followed by required monitoring for 30 minutes.    Traditional build-up -- weekly visits for 6 -12 months until reaching "maintenance dose", then continue weekly until eventually spacing out to every 4 weeks as above. At these appointments, the injections are administered, followed by required monitoring for 30 minutes.     Either way is acceptable, and both are equally effective. With the rush protocol, the advantage is that less time is spent here for injections overall AND you would also reach maintenance dosing faster (which is when the clinical benefit starts to become more apparent). Not everyone is a candidate for rapid desensitization.   IF we proceed with the RUSH protocol, there are premedications which must be taken the day before and the day after the rush only (this includes antihistamines, steroids, and Singulair ).  After the rush day, no prednisone  or Singulair  is required, and we just recommend antihistamines taken on your injection day.  What Is An Estimate of the Costs?  If you are interested in starting allergy injections, please check with your insurance company about your coverage for both allergy vial sets and allergy injections.  Please do so prior to making the appointment to start injections.  The following are CPT codes to give to your insurance company. These are the amounts we BILL to the insurance  company, but the amount YOU WILL PAY and WE RECEIVE IS SUBSTANTIALLY LESS and depends on the contracts we have with different insurance companies.   Amount Billed to Insurance One allergy vial set  CPT 95165   $ 1200     Two allergy vial set  CPT 95165   $ 2400     Three allergy vial set  CPT 95165   $ 3600     One injection   CPT 95115   $ 35  Two injections   CPT 95117   $ 40 RUSH (Rapid Desensitization) CPT 95180 x 8 hours $500/hour  Regarding the allergy injections, your co-pay may or may not apply with each injection, so please confirm this with your insurance company. When you start allergy injections, 1 or 2 sets of vials are made based on your allergies.  Not all patients can be on one set of vials. A set of vials lasts 6 months to a year depending on how quickly you can proceed with your build-up of your allergy injections. Vials are personalized for each patient depending on their specific allergens.  How often are allergy injection given during the build-up period?   Injections are given at least weekly during the build-up period  until your maintenance dose is achieved. Per the doctor's discretion, you may have the option of getting allergy injections two times per week during the build-up period. However, there must be at least 48 hours between injections. The build-up period is usually completed within 6-12 months depending on your ability to schedule injections and for adjustments for reactions. When maintenance dose is reached, your injection schedule is gradually changed to every two weeks and later to every three weeks. Injections will then continue every 4 weeks. Usually, injections are continued for a total of 3-5 years.   When Will I Feel Better?  Some may experience decreased allergy symptoms during the build-up phase. For others, it may take as long as 12 months on the maintenance dose. If there is no improvement after a year of maintenance, your allergist will discuss other  treatment options with you.  If you aren't responding to allergy shots, it may be because there is not enough dose of the allergen in your vaccine or there are missing allergens that were not identified during your allergy testing. Other reasons could be that there are high levels of the allergen in your environment or major exposure to non-allergic triggers like tobacco smoke.  What Is the Length of Treatment?  Once the maintenance dose is reached, allergy shots are generally continued for three to five years. The decision to stop should be discussed with your allergist at that time. Some people may experience a permanent reduction of allergy symptoms. Others may relapse and a longer course of allergy shots can be considered.  What Are the Possible Reactions?  The two types of adverse reactions that can occur with allergy shots are local and systemic. Common local reactions include very mild redness and swelling at the injection site, which can happen immediately or several hours after. Report a delayed reaction from your last injection. These include arm swelling or runny nose, watery eyes or cough that occurs within 12-24 hours after injection. A systemic reaction, which is less common, affects the entire body or a particular body system. They are usually mild and typically respond quickly to medications. Signs include increased allergy symptoms such as sneezing, a stuffy nose or hives.   Rarely, a serious systemic reaction called anaphylaxis can develop. Symptoms include swelling in the throat, wheezing, a feeling of tightness in the chest, nausea or dizziness. Most serious systemic reactions develop within 30 minutes of allergy shots. This is why it is strongly recommended you wait in your doctor's office for 30 minutes after your injections. Your allergist is trained to watch for reactions, and his or her staff is trained and equipped with the proper medications to identify and treat them.    Report to the nurse immediately if you experience any of the following symptoms: swelling, itching or redness of the skin, hives, watery eyes/nose, breathing difficulty, excessive sneezing, coughing, stomach pain, diarrhea, or light headedness. These symptoms may occur within 15-20 minutes after injection and may require medication.   Who Should Administer Allergy Shots?  The preferred location for receiving shots is your prescribing allergist's office. Injections can sometimes be given at another facility where the physician and staff are trained to recognize and treat reactions, and have received instructions by your prescribing allergist.  What if I am late for an injection?   Injection dose will be adjusted depending upon how many days or weeks you are late for your injection.   What if I am sick?   Please report any illness  to the nurse before receiving injections. She may adjust your dose or postpone injections depending on your symptoms. If you have fever, flu, sinus infection or chest congestion it is best to postpone allergy injections until you are better. Never get an allergy injection if your asthma is causing you problems. If your symptoms persist, seek out medical care to get your health problem under control.  What If I am or Become Pregnant:  Women that become pregnant should schedule an appointment with [facility] before receiving any further allergy injections.

## 2024-04-19 NOTE — Progress Notes (Unsigned)
 FOLLOW UP  Date of Service/Encounter:  04/19/24   Assessment:   Moderate persistent asthma, uncomplicated    Perennial and seasonal allergic rhinitis (grasses, ragweed, weeds, trees, indoor molds, outdoor molds, dust mites, and dog)   Flexural atopic dermatitis  Plan/Recommendations:   1. Moderate persistent asthma, uncomplicated - Lung testing not done today.  - Spacer sample and demonstration provided. - Daily controller medication(s): Symbicort  160/4.40mcg two puffs twice daily with spacer - Prior to physical activity: albuterol  2 puffs 10-15 minutes before physical activity. - Rescue medications: albuterol  4 puffs every 4-6 hours as needed - Changes during respiratory infections or worsening symptoms: Add on budesonide  nebulizer one treatment twice daily for 1-2 WEEKS - Asthma control goals:  * Full participation in all desired activities (may need albuterol  before activity) * Albuterol  use two time or less a week on average (not counting use with activity) * Cough interfering with sleep two time or less a month * Oral steroids no more than once a year * No hospitalizations  2. Chronic rhinitis - Testing today showed: grasses, ragweed, weeds, trees, indoor molds, outdoor molds, dust mites, and dog - Copy of test results provided. - Avoidance measures provided. - Continue with: Singulair  (montelukast ) 10mg  daily and Flonase  (fluticasone ) two sprays per nostril daily (AIM FOR EAR ON EACH SIDE) - Start taking: Xyzal  (levocetirizine) 5mg  tablet once daily - You can use an extra dose of the antihistamine, if needed, for breakthrough symptoms.  - Consider nasal saline rinses 1-2 times daily to remove allergens from the nasal cavities as well as help with mucous clearance (this is especially helpful to do before the nasal sprays are given) - Strongly consider allergy shots as a means of long-term control. - Allergy shots re-train and reset the immune system to ignore  environmental allergens and decrease the resulting immune response to those allergens (sneezing, itchy watery eyes, runny nose, nasal congestion, etc).    - Allergy shots improve symptoms in 75-85% of patients.  - We can discuss more at the next appointment if the medications are not working for you.  3. Flexural atopic dermatitis - Continue with triamcinolone  0.1% ointment twice daily. - You can use this on your testing sites on your arm and back, too!  - Continue with the moisturizing.  4. Return in about 3 months (around 07/19/2024). You can have the follow up appointment with Dr. Iva or a Nurse Practicioner (our Nurse Practitioners are excellent and always have Physician oversight!).    Subjective:   Carolyn Aguilar is a 18 y.o. female presenting today for follow up of  Chief Complaint  Patient presents with   Allergy Testing    Carolyn Aguilar has a history of the following: Patient Active Problem List   Diagnosis Date Noted   Asthma with status asthmaticus 05/01/2012   Hypoxemia 05/01/2012    History obtained from: chart review and patient.  Discussed the use of AI scribe software for clinical note transcription with the patient and/or guardian, who gave verbal consent to proceed.  Carolyn Aguilar is a 18 y.o. female presenting for skin testing. She was last seen on August 21st. We could not do testing because her insurance company does not cover testing on the same day as a New Patient visit. She has been off of all antihistamines 3 days in anticipation of the testing.   At that time, lung testing looked great.  We continued with albuterol  but added on Symbicort  2 puffs twice daily with a  spacer.  For her rhinitis, we decided to do allergy testing. Atopic dermatitis was controlled with moisturizing, but we did add on triamcinolone  to use twice daily.  She reports a history of allergies to dust mites, grasses, weeds, several trees, and mold. Previously allergic to peanuts, she can  now consume them without issues.  Her symptoms, including significant itching, worsen in the spring and summer. She describes pervasive itching, particularly on her back, which she often cannot reach.  She has not previously received allergy shots.  Otherwise, there have been no changes to her past medical history, surgical history, family history, or social history.    Review of systems otherwise negative other than that mentioned in the HPI.    Objective:   There were no vitals taken for this visit. There is no height or weight on file to calculate BMI.    Physical exam deferred since this was a skin testing appointment only.   Diagnostic studies:   Allergy Studies:     Airborne Adult Perc - 04/19/24 1517     Time Antigen Placed 1517    Allergen Manufacturer Jestine    Location Back    Number of Test 55    1. Control-Buffer 50% Glycerol Negative    2. Control-Histamine 2+    3. Bahia Negative    4. [place] Negative    5. Johnson Negative    6. [place]  Blue 3+    7. Meadow Fescue 3+    8. Perennial Rye 3+    9. Timothy Negative    10. Ragweed Mix Negative    11. Cocklebur Negative    12. Plantain,  English Negative    13. Baccharis Negative    14. Dog Fennel Negative    15. Russian Thistle Negative    16. Lamb's Quarters Negative    17. Sheep Sorrell Negative    18. Rough Pigweed Negative    19. Marsh Elder, Rough Negative    20. Mugwort, Common 2+    21. Box, Elder 2+    22. Cedar, red 2+    23. Sweet Gum 2+    24. Pecan Pollen Negative    25. Pine Mix Negative    26. Walnut, Black Pollen 2+    27. Red Mulberry Negative    28. Ash Mix Negative    29. Birch Mix Negative    30. Beech American Negative    31. Cottonwood, [place]au Negative    32. Hickory, White 3+    33. Maple Mix 3+    34. Oak, [place] Mix 3+    35. Sycamore Eastern 3+    36. Alternaria Alternata Negative    37. Cladosporium Herbarum Negative    38. Aspergillus Mix Negative    39.  Penicillium Mix Negative    40. Bipolaris Sorokiniana (Helminthosporium) Negative    41. Drechslera Spicifera (Curvularia) Negative    42. Mucor Plumbeus Negative    43. Fusarium Moniliforme 3+    44. Aureobasidium Pullulans (pullulara) Negative    45. Rhizopus Oryzae Negative    46. Botrytis Cinera Negative    47. Epicoccum Nigrum Negative    48. Phoma Betae Negative    49. Dust Mite Mix 4+    50. Cat Hair 10,000 BAU/ml Negative    51.  Dog Epithelia Negative    52. Mixed Feathers Negative    53. Horse Epithelia Negative    54. Cockroach, German Negative    55. Tobacco Leaf Negative  Intradermal - 04/19/24 1610     Time Antigen Placed 1600    Allergen Manufacturer Jestine    Location Arm    Number of Test 11    Control Negative    Bahia 2+    [place] 2+    Johnson Negative    Ragweed Mix 3+    Mold 1 3+    Mold 2 3+    Mold 3 3+    Mold 4 Omitted    Cat Negative    Dog 3+    Cockroach Negative          Allergy testing results were read and interpreted by myself, documented by clinical staff.      Marty Shaggy, MD  [facility] 

## 2024-04-20 ENCOUNTER — Encounter: Payer: Self-pay | Admitting: Allergy & Immunology

## 2024-04-26 DIAGNOSIS — Z23 Encounter for immunization: Secondary | ICD-10-CM | POA: Diagnosis not present

## 2024-05-04 ENCOUNTER — Encounter: Payer: Self-pay | Admitting: Psychology

## 2024-05-04 ENCOUNTER — Ambulatory Visit: Admitting: Psychology

## 2024-05-04 DIAGNOSIS — F411 Generalized anxiety disorder: Secondary | ICD-10-CM | POA: Diagnosis not present

## 2024-05-04 DIAGNOSIS — F840 Autistic disorder: Secondary | ICD-10-CM | POA: Diagnosis not present

## 2024-05-04 NOTE — Progress Notes (Signed)
 [facility] Counselor/Therapist Progress Note  Patient ID: Carolyn Aguilar, MRN: [id number],    Date: 05/04/2024  Time Spent: 10:40 - 11:25 am   Treatment Type: Individual Therapy Met with patient for therapy session.  Patient was at home and session was conducted from therapist's office via video conferencing.  Patient expressed awareness of the limitations related to video sessions and verbally consented to telehealth.     Reported Symptoms: Patient presents with a history of social interaction and cognitive processing deficits resulting in social and learning difficulty during childhood along with delayed independence as an adult. Patient has been previously diagnosed with ADHD, along with anxiety and depressive disorders, but Autism spectrum disorder (ASD) was indicated during recent psychological evaluation by this provider (April 2025).  Current concerns include being able to maintain eat healthy and exercise, along with worries about her family when she takes an upcoming trip.    Mental Status Exam: Appearance:  Casual and Neatly Groomed     Behavior: Appropriate  Motor: Normal  Speech/Language:  Clear and Coherent and Normal Rate  Affect: Restricted  Mood: euthymic  Thought process: normal  Thought content:   WNL  Sensory/Perceptual disturbances:   WNL  Orientation: oriented to person, place, time/date, and situation  Attention: Good  Concentration: Good  Memory: WNL  Fund of knowledge:  Fair  Insight:   Fair  Judgment:  Good  Impulse Control: Good    Risk Assessment: Danger to Self:  No Self-injurious Behavior: No Danger to Others: No Duty to Warn:no Physical Aggression / Violence:No   Subjective: Patient stated that she has has trouble consistently eating healthy and exercising. She struggles with staying asleep, due to worries, which makes tired tired during the day.  She heard back from vocational rehabilitation and they will be working with her soon to  help her with job training.  She reported continuing to see the person she met online for local activities, but they are still just in a talking stage. Patient is went on a cruise with her family but did not enjoy the cruise because she was sea sick (took the medication at the wrong time).    Interventions: Cognitive Behavioral Therapy, Psychologist, occupational, Mindfulness Meditation, and [employer], Executive function training Emphasis was on shifting attention away from worries when trying to sleep toward relaxing images and thoughts.  Also discussed was focusing on the controllable aspects of her worries and being present focused.          Assessment: Patient appeared had more to discuss this session as she used a note book to be more organized, and write down discussion topics.      Diagnosis:Generalized anxiety disorder  Autism spectrum disorder  Plan: Individual counseling is recommended to continue help Carolyn Aguilar learn approaches to manage her anxiety and depressed mood, while understanding more about compensations strategies for ASD and executive function deficits. Emphasis is on helping Carolyn Aguilar learn social and independent skills in a graduated manner that prevents her from becoming overwhelmed.   Goal Enhance ability to effectively cope with the full variety of life's anxieties.  Objective Using coping skills to stay calm while making decisions. Target Date: 2025-01-15 Frequency: Biweekly  Progress: 35 Modality: individual   Goal Improve self esteem by increasing positive self statements.   Objective Make at least one positive self statement daily. Target Date: 2025-01-15 Frequency: Biweekly  Progress: 40 Modality: individual   Sakeenah Valcarcel, PhD  Nature Vogelsang, PhD

## 2024-05-16 DIAGNOSIS — R051 Acute cough: Secondary | ICD-10-CM | POA: Diagnosis not present

## 2024-05-16 DIAGNOSIS — J029 Acute pharyngitis, unspecified: Secondary | ICD-10-CM | POA: Diagnosis not present

## 2024-05-19 ENCOUNTER — Encounter: Payer: Self-pay | Admitting: Psychology

## 2024-05-19 ENCOUNTER — Ambulatory Visit: Admitting: Psychology

## 2024-05-19 DIAGNOSIS — F411 Generalized anxiety disorder: Secondary | ICD-10-CM

## 2024-05-19 DIAGNOSIS — F840 Autistic disorder: Secondary | ICD-10-CM

## 2024-05-19 NOTE — Progress Notes (Signed)
 [facility] Counselor/Therapist Progress Note  Patient ID: Carolyn Aguilar, MRN: [id number],    Date: 05/19/2024  Time Spent: 2:00 - 2:50 pm   Treatment Type: Individual Therapy Met with patient for therapy session.  Patient was at the clinic and session was conducted from therapist's office in person.     Reported Symptoms: Patient presents with a history of social interaction and cognitive processing deficits resulting in social and learning difficulty during childhood along with delayed independence as an adult. Patient has been previously diagnosed with ADHD, along with anxiety and depressive disorders, but Autism spectrum disorder (ASD) was indicated during recent psychological evaluation by this provider (April 2025).  Current concerns include excessive fatigue, headaches, and stomach aches, along with adjusting to being diagnosed with several allergies.    Mental Status Exam: Appearance:  Casual and Neatly Groomed     Behavior: Appropriate  Motor: Normal  Speech/Language:  Clear and Coherent and Normal Rate  Affect: Restricted  Mood: frustrated  Thought process: normal  Thought content:   WNL  Sensory/Perceptual disturbances:   WNL  Orientation: oriented to person, place, time/date, and situation  Attention: Good  Concentration: Good  Memory: WNL  Fund of knowledge:  Fair  Insight:   Fair  Judgment:  Good  Impulse Control: Good    Risk Assessment: Danger to Self:  No Self-injurious Behavior: No Danger to Others: No Duty to Warn:no Physical Aggression / Violence:No   Subjective: Patient stated that she has been highly frustrated related to excessive fatigue, headaches, and stomach aches.  Recent medical testing led to being diagnosed with several allergies, although the headaches could not be explained.  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  More positively, she reported continuing to have good interactions with her female friend and looking forward to her birth day in November.     Interventions: Cognitive Behavioral Therapy, Psychologist, occupational, Mindfulness Meditation, and [employer], Executive function training Emphasis was on focusing on what she can control medically and shifting attention away from the uncontrollable aspects of her pain and symptoms.   She was urged to accept the pain and work around it rather than trying to avoid it or wish it away.        Assessment: Patient reported being able to enjoy discussing her frustrations, especially leaving the house to be able to do so.  It will be difficult managing her allergies with being allergic to so many of the things in her life that she enjoys.       Diagnosis:Generalized anxiety disorder  Autism spectrum disorder  Plan: Individual counseling is recommended to continue help Ms. Mcluckie learn approaches to manage her anxiety and depressed mood, while understanding more about compensations strategies for ASD and executive function deficits. Emphasis is on helping Ms. Nitta learn social and independent skills in a graduated manner that prevents her from becoming overwhelmed.   Goal Enhance ability to effectively cope with the full variety of life's anxieties.  Objective Using coping skills to stay calm while making decisions. Target Date: 2025-01-15 Frequency: Biweekly  Progress: 40 Modality: individual   Goal Improve self esteem by increasing positive self statements.   Objective Make at least one positive self statement daily. Target Date: 2025-01-15 Frequency: Biweekly  Progress: 45 Modality: individual   Bertine Schlottman, PhD  Montavius Subramaniam, PhD

## 2024-05-26 DIAGNOSIS — Z23 Encounter for immunization: Secondary | ICD-10-CM | POA: Diagnosis not present

## 2024-06-07 DIAGNOSIS — E559 Vitamin D deficiency, unspecified: Secondary | ICD-10-CM | POA: Diagnosis not present

## 2024-06-07 DIAGNOSIS — F320 Major depressive disorder, single episode, mild: Secondary | ICD-10-CM | POA: Diagnosis not present

## 2024-06-07 DIAGNOSIS — Z0000 Encounter for general adult medical examination without abnormal findings: Secondary | ICD-10-CM | POA: Diagnosis not present

## 2024-06-07 DIAGNOSIS — F411 Generalized anxiety disorder: Secondary | ICD-10-CM | POA: Diagnosis not present

## 2024-06-07 DIAGNOSIS — Z23 Encounter for immunization: Secondary | ICD-10-CM | POA: Diagnosis not present

## 2024-06-07 DIAGNOSIS — Z13220 Encounter for screening for lipoid disorders: Secondary | ICD-10-CM | POA: Diagnosis not present

## 2024-06-07 DIAGNOSIS — R519 Headache, unspecified: Secondary | ICD-10-CM | POA: Diagnosis not present

## 2024-06-07 DIAGNOSIS — F902 Attention-deficit hyperactivity disorder, combined type: Secondary | ICD-10-CM | POA: Diagnosis not present

## 2024-06-07 DIAGNOSIS — R7303 Prediabetes: Secondary | ICD-10-CM | POA: Diagnosis not present

## 2024-06-10 ENCOUNTER — Telehealth (INDEPENDENT_AMBULATORY_CARE_PROVIDER_SITE_OTHER): Payer: Self-pay

## 2024-06-10 DIAGNOSIS — G44229 Chronic tension-type headache, not intractable: Secondary | ICD-10-CM

## 2024-06-10 DIAGNOSIS — F411 Generalized anxiety disorder: Secondary | ICD-10-CM

## 2024-06-10 DIAGNOSIS — G43709 Chronic migraine without aura, not intractable, without status migrainosus: Secondary | ICD-10-CM

## 2024-06-10 NOTE — Addendum Note (Signed)
 Addended by: Julee Stoll on: 06/10/2024 01:50 PM   Modules accepted: Orders

## 2024-06-10 NOTE — Telephone Encounter (Signed)
 SABRA

## 2024-06-14 ENCOUNTER — Ambulatory Visit (INDEPENDENT_AMBULATORY_CARE_PROVIDER_SITE_OTHER): Admitting: Pediatrics

## 2024-06-14 ENCOUNTER — Encounter (INDEPENDENT_AMBULATORY_CARE_PROVIDER_SITE_OTHER): Payer: Self-pay | Admitting: Pediatrics

## 2024-06-14 VITALS — BP 122/70 | HR 92 | Ht 60.24 in | Wt 245.6 lb

## 2024-06-14 DIAGNOSIS — R4189 Other symptoms and signs involving cognitive functions and awareness: Secondary | ICD-10-CM

## 2024-06-14 DIAGNOSIS — R1013 Epigastric pain: Secondary | ICD-10-CM | POA: Diagnosis not present

## 2024-06-14 DIAGNOSIS — R1084 Generalized abdominal pain: Secondary | ICD-10-CM | POA: Diagnosis not present

## 2024-06-14 DIAGNOSIS — F411 Generalized anxiety disorder: Secondary | ICD-10-CM

## 2024-06-14 DIAGNOSIS — G44229 Chronic tension-type headache, not intractable: Secondary | ICD-10-CM

## 2024-06-14 DIAGNOSIS — G43709 Chronic migraine without aura, not intractable, without status migrainosus: Secondary | ICD-10-CM

## 2024-06-14 DIAGNOSIS — K219 Gastro-esophageal reflux disease without esophagitis: Secondary | ICD-10-CM | POA: Diagnosis not present

## 2024-06-14 DIAGNOSIS — K5909 Other constipation: Secondary | ICD-10-CM | POA: Diagnosis not present

## 2024-06-14 MED ORDER — ONDANSETRON 4 MG PO TBDP: 4.0000 mg | ORAL_TABLET | Freq: Three times a day (TID) | ORAL | 1 refills | Status: AC | PRN

## 2024-06-14 NOTE — Patient Instructions (Addendum)
 Begin taking MigRelief supplements nightly for headache prevention [facility] [address], [place], [place] [address] Referral to adult neurology They will call you to schedule appointment Have appropriate hydration and sleep and limited screen time Make a headache diary May take occasional Tylenol  or ibuprofen for moderate to severe headache, maximum 2 or 3 times a week   It was a pleasure to see you in clinic today.    Feel free to contact our office during normal business hours at [phone] with questions or concerns. If there is no answer or the call is outside business hours, please leave a message and our clinic staff will call you back within the next business day.  If you have an urgent concern, please stay on the line for our after-hours answering service and ask for the on-call neurologist.    I also encourage you to use MyChart to communicate with me more directly. If you have not yet signed up for MyChart within [facility], the front desk staff can help you. However, please note that this inbox is NOT monitored on nights or weekends, and response can take up to 2 business days.  Urgent matters should be discussed with the on-call pediatric neurologist.   Asberry Moles, DNP, CPNP-PC Pediatric Neurology

## 2024-06-14 NOTE — Progress Notes (Signed)
 Patient: Carolyn Aguilar MRN: [id number] Sex: female DOB: 2006/05/24  Provider: Asberry Moles, NP Location of Care: [facility]  Note type: Routine follow-up  History of Present Illness:  Carolyn Aguilar is a 18 y.o. female with history of anxiety, depression, asthma, tension-type headache, migraine without aura, and asthma who I am seeing for routine follow-up. Patient was last seen on 11/03/2022 where she was continued on topamax  for headache prevention. Since the last appointment, she reports headaches have been worsening in intensity and seem to be constant. She localizes pain to her forehead and describes the pain as vibrating pain. She endorses associated symptoms of nausea, photophobia, phonophobia, memory loss. When she experiences headaches she will take medication and use ice packs to ease pain. She has been trying all varieties of OTC headache medications but has not found one that resolves headache. She does not sleep well at night. She has insomnia and has trouble falling asleep when headaches are severe. She has an OK appetite but has been less recently. She has been taking adderall. She stays hydrated.   Patient presents today with mother.     Past Medical History: Past Medical History:  Diagnosis Date   Asthma    Eczema    Headache   Anxiety Depression Tension-type headache Migraine without aura  Past Surgical History: History reviewed. No pertinent surgical history.  Allergy : No Known Allergies  Medications: Current Outpatient Medications on File Prior to Visit  Medication Sig Dispense Refill   albuterol  (PROVENTIL  HFA;VENTOLIN  HFA) 108 (90 Base) MCG/ACT inhaler Inhale 2 puffs into the lungs every 6 (six) hours as needed. For shortness of breath 8 g 0   albuterol  (PROVENTIL ) (2.5 MG/3ML) 0.083% nebulizer solution Take 3 mLs (2.5 mg total) by nebulization every 6 (six) hours as needed for wheezing or shortness of breath. 75 mL 0    amphetamine-dextroamphetamine (ADDERALL XR) 10 MG 24 hr capsule Take 10 mg by mouth every morning.     budesonide  (PULMICORT ) 0.5 MG/2ML nebulizer solution Take by nebulization daily.     budesonide -formoterol  (SYMBICORT ) 160-4.5 MCG/ACT inhaler Inhale 2 puffs into the lungs in the morning and at bedtime. 1 each 5   clobetasol cream (TEMOVATE) 0.05 % Apply topically.     famotidine (PEPCID) 40 MG tablet Take 40 mg by mouth daily.     Ferrous Sulfate (IRON PO) Take by mouth.     fluticasone  (FLONASE ) 50 MCG/ACT nasal spray Place 2 sprays into both nostrils daily.     hydrOXYzine  (VISTARIL ) 25 MG capsule Take 1 capsule (25 mg total) by mouth at bedtime and may repeat dose one time if needed. 60 capsule 0   hyoscyamine (LEVSIN) 0.125 MG tablet Take by mouth every morning.     JUNEL FE 1.5/30 1.5-30 MG-MCG tablet Take 1 tablet by mouth daily.     levocetirizine (XYZAL ) 5 MG tablet Take 1 tablet (5 mg total) by mouth every evening. 90 tablet 3   montelukast  (SINGULAIR ) 10 MG tablet Take 1 tablet (10 mg total) by mouth at bedtime. 90 tablet 3   omeprazole (PRILOSEC) 40 MG capsule TAKE 1 CAPSULE BY MOUTH EVERY DAY 30 MINUTES BEFORE BREAKFAST     sertraline (ZOLOFT) 100 MG tablet Take 100 mg by mouth daily.     topiramate  (TOPAMAX ) 25 MG tablet TAKE 1 TABLET(25 MG) BY MOUTH TWICE DAILY 62 tablet 5   triamcinolone  ointment (KENALOG ) 0.1 % Apply 1 Application topically 2 (two) times daily. 80 g 3  No current facility-administered medications on file prior to visit.   Developmental history: she achieved developmental milestone at appropriate age.   Family History family history includes Allergic rhinitis in her mother; Angioedema in her mother; Diabetes in her paternal grandfather; Eczema in her maternal uncle; Hypertension in her mother.  There is no family history of speech delay, learning difficulties in school, intellectual disability, epilepsy or neuromuscular disorders.   Social History Social  History   Social History Narrative   Graduated Engineer, [employer]   Lives at home with parents      Review of Systems Constitutional: Negative for fever, malaise/fatigue and weight loss.  HENT: Negative for congestion, ear pain, hearing loss, sinus pain and sore throat.   Eyes: Negative for blurred vision, double vision, photophobia, discharge and redness.  Respiratory: Negative for cough, shortness of breath and wheezing.   Cardiovascular: Negative for chest pain, palpitations and leg swelling.  Gastrointestinal: Negative for abdominal pain, blood in stool, constipation, nausea and vomiting.  Genitourinary: Negative for dysuria and frequency.  Musculoskeletal: Negative for back pain, falls, joint pain and neck pain.  Skin: Negative for rash.  Neurological: Negative for dizziness, tremors, focal weakness, seizures, weakness. Positive for headaches.   Psychiatric/Behavioral: Negative for memory loss. The patient is not nervous/anxious and does not have insomnia.   Physical Exam BP 122/70 (BP Location: Right Arm, Patient Position: Sitting, Cuff Size: Large)   Pulse 92   Ht 5' 0.24 (1.53 m)   Wt 245 lb 9.6 oz (111.4 kg)   LMP 05/31/2024 (Approximate)   BMI 47.59 kg/m   Gen: well appearing female Skin: No rash, No neurocutaneous stigmata. HEENT: Normocephalic, no dysmorphic features, no conjunctival injection, nares patent, mucous membranes moist, oropharynx clear. Neck: Supple, no meningismus. No focal tenderness. Resp: Clear to auscultation bilaterally CV: Regular rate, normal S1/S2, no murmurs, no rubs Abd: BS present, abdomen soft, non-tender, non-distended. No hepatosplenomegaly or mass Ext: Warm and well-perfused. No deformities, no muscle wasting, ROM full.  Neurological Examination: MS: Awake, alert, interactive. Normal eye contact, answered the questions appropriately for age, speech was fluent,  Normal comprehension.  Attention and concentration were normal. Cranial  Nerves: Pupils were equal and reactive to light;  EOM normal, no nystagmus; no ptsosis, intact facial sensation, face symmetric with full strength of facial muscles, hearing intact to finger rub bilaterally, palate elevation is symmetric.  Sternocleidomastoid and trapezius are with normal strength. Motor-Normal tone throughout, Normal strength in all muscle groups. No abnormal movements Reflexes- Reflexes 2+ and symmetric in the biceps, triceps, patellar and achilles tendon. Plantar responses flexor bilaterally, no clonus noted Sensation: Intact to light touch throughout.  Romberg negative. Coordination: No dysmetria on FTN test. Fine finger movements and rapid alternating movements are within normal range.  Mirror movements are not present.  There is no evidence of tremor, dystonic posturing or any abnormal movements.No difficulty with balance when standing on one foot bilaterally.   Gait: Normal gait. Tandem gait was normal. Was able to perform toe walking and heel walking without difficulty.   Assessment 1. Chronic migraine without aura without status migrainosus, not intractable   2. Chronic tension-type headache, not intractable   3. Anxiety state   4. Concern about memory     Carolyn Aguilar is a 18 y.o. female with history of anxiety, depression, asthma, tension-type headache, migraine without aura, and asthma who I am seeing for routine follow-up. She has seen recent increase in intensity of headaches with new associated symptom of memory  loss. Physical and neurological exam unremarkable. Would recommend to begin MigRelief for headache prevention. Will obtain labs as part of headache workup to assess for any abnormalities that could be contributing to symptoms. Will place referral to adult neurology for further evaluation and treatment for more options for oral and injectable medications.    PLAN: Labs MigRelief Have appropriate hydration and sleep and limited screen time Make a headache  diary May take occasional Tylenol  or ibuprofen for moderate to severe headache, maximum 2 or 3 times a week Return for follow-up visit with adult neurology   Counseling/Education: supplements for headache prevention    Total time spent with the patient was 40 minutes, of which 50% or more was spent in counseling and coordination of care.   The plan of care was discussed, with acknowledgement of understanding expressed by her mother.   Asberry Moles, DNP, CPNP-PC [facility] Pediatric [facility]rology  [phone] [address] [place], [place] [address] Phone: [phone]

## 2024-06-15 LAB — CBC WITH DIFFERENTIAL/PLATELET
Basophils Absolute: 0.1 x10E3/uL (ref 0.0–0.2)
Basos: 1 %
EOS (ABSOLUTE): 0.4 x10E3/uL (ref 0.0–0.4)
Eos: 5 %
Hematocrit: 42.9 % (ref 34.0–46.6)
Hemoglobin: 13.2 g/dL (ref 11.1–15.9)
Immature Grans (Abs): 0 x10E3/uL (ref 0.0–0.1)
Immature Granulocytes: 0 %
Lymphocytes Absolute: 2.6 x10E3/uL (ref 0.7–3.1)
Lymphs: 33 %
MCH: 24.6 pg — ABNORMAL LOW (ref 26.6–33.0)
MCHC: 30.8 g/dL — ABNORMAL LOW (ref 31.5–35.7)
MCV: 80 fL (ref 79–97)
Monocytes Absolute: 0.6 x10E3/uL (ref 0.1–0.9)
Monocytes: 7 %
Neutrophils Absolute: 4.3 x10E3/uL (ref 1.4–7.0)
Neutrophils: 54 %
Platelets: 287 x10E3/uL (ref 150–450)
RBC: 5.37 x10E6/uL — ABNORMAL HIGH (ref 3.77–5.28)
RDW: 15.4 % (ref 11.7–15.4)
WBC: 7.9 x10E3/uL (ref 3.4–10.8)

## 2024-06-15 LAB — THYROID PANEL WITH TSH
Free Thyroxine Index: 1.5 (ref 1.2–4.9)
T3 Uptake Ratio: 14 % — ABNORMAL LOW (ref 24–39)
T4, Total: 11 ug/dL (ref 4.5–12.0)
TSH: 1.3 u[IU]/mL (ref 0.450–4.500)

## 2024-06-15 LAB — BASIC METABOLIC PANEL WITH GFR
BUN/Creatinine Ratio: 8 — ABNORMAL LOW (ref 9–23)
BUN: 8 mg/dL (ref 6–20)
CO2: 21 mmol/L (ref 20–29)
Calcium: 9.5 mg/dL (ref 8.7–10.2)
Chloride: 101 mmol/L (ref 96–106)
Creatinine, Ser: 0.95 mg/dL (ref 0.57–1.00)
Glucose: 102 mg/dL — ABNORMAL HIGH (ref 70–99)
Potassium: 4.5 mmol/L (ref 3.5–5.2)
Sodium: 138 mmol/L (ref 134–144)

## 2024-06-15 LAB — FERRITIN: Ferritin: 67 ng/mL (ref 15–150)

## 2024-06-15 LAB — VITAMIN D 25 HYDROXY (VIT D DEFICIENCY, FRACTURES): Vit D, 25-Hydroxy: 42.5 ng/mL (ref 30.0–100.0)

## 2024-06-21 ENCOUNTER — Encounter (INDEPENDENT_AMBULATORY_CARE_PROVIDER_SITE_OTHER): Payer: Self-pay

## 2024-06-22 ENCOUNTER — Ambulatory Visit (INDEPENDENT_AMBULATORY_CARE_PROVIDER_SITE_OTHER): Admitting: Psychology

## 2024-06-22 ENCOUNTER — Encounter: Payer: Self-pay | Admitting: Psychology

## 2024-06-22 DIAGNOSIS — F840 Autistic disorder: Secondary | ICD-10-CM

## 2024-06-22 DIAGNOSIS — F411 Generalized anxiety disorder: Secondary | ICD-10-CM | POA: Diagnosis not present

## 2024-06-22 NOTE — Progress Notes (Signed)
 [facility] Counselor/Therapist Progress Note  Patient ID: Carolyn Aguilar, MRN: [id number],    Date: 06/22/2024  Time Spent: 10:30 - 11:00 am   Treatment Type: Individual Therapy Met with patient for therapy session.  Patient was at home and session was conducted from therapist's office via video conferencing.  Patient expressed awareness of the limitations related to video sessions and verbally consented to telehealth.     Reported Symptoms: Patient presents with a history of social interaction and cognitive processing deficits resulting in social and learning difficulty during childhood along with delayed independence as an adult. Patient has been previously diagnosed with ADHD, along with anxiety and depressive disorders, but Autism spectrum disorder (ASD) was indicated during recent psychological evaluation by this provider (April 2025).  Current concerns include excessive fatigue, headaches, and stomach aches, along with adjusting to being diagnosed with several allergies.    Mental Status Exam: Appearance:  Casual and Neatly Groomed     Behavior: Appropriate  Motor: Normal  Speech/Language:  Clear and Coherent and Normal Rate  Affect: Restricted  Mood: Euthymic  Thought process: normal  Thought content:   WNL  Sensory/Perceptual disturbances:   WNL  Orientation: oriented to person, place, time/date, and situation  Attention: Good  Concentration: Good  Memory: WNL  Fund of knowledge:  Fair  Insight:   Fair  Judgment:  Good  Impulse Control: Good    Risk Assessment: Danger to Self:  No Self-injurious Behavior: No Danger to Others: No Duty to Warn:no Physical Aggression / Violence:No   Subjective: Patient stated that she continues to have the headaches and stomach aches, but has been addressing these through medication and diet.  Patient indicated putting more effort into self care overall, as she is now using a planner to track diet, physical activity,  headaches, and stress.  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  The biggest area of concern at this time was feeling jealous over what over people her age are achieving/having that she has not been able to attain.      Interventions: Cognitive Behavioral Therapy, Psychologist, Occupational, Mindfulness Meditation, and [employer], Executive function training Emphasis was on focusing on what she has in the present with the idea that she can attain what she wants in the future if she has a plan and works toward doing so.  Emphasis was on working slowly at a comfortable pace to keep from becoming overwhelmed and stopping altogether.      Assessment: Patient appears to be taking a more serious approach to her self care and adult development.  She will need to maintain motivation through her values rather than emotions to continue to do these activities.         Diagnosis:Generalized anxiety disorder  Autism spectrum disorder  Plan: Individual counseling is recommended to continue help [name] learn approaches to manage her anxiety and depressed mood, while understanding more about compensations strategies for ASD and executive function deficits. Emphasis is on helping [name] learn social and independent skills in a graduated manner that prevents her from becoming overwhelmed.   Goal Enhance ability to effectively cope with the full variety of life's anxieties.  Objective Using coping skills to stay calm while making decisions. Target Date: 2025-01-15 Frequency: Biweekly  Progress: 50 Modality: individual   Goal Improve self esteem by increasing positive self statements.   Objective Make at  least one positive self statement daily. Target Date: 2025-01-15 Frequency: Biweekly  Progress: 55 Modality: individual   Marky Buresh,  PhD                                                                             Paislyn Domenico, PhD              Tammie Ellsworth, PhD

## 2024-07-18 ENCOUNTER — Ambulatory Visit: Admission: EM | Admit: 2024-07-18 | Discharge: 2024-07-18 | Disposition: A | Discharge status: Home / Self Care

## 2024-07-18 DIAGNOSIS — J45901 Unspecified asthma with (acute) exacerbation: Secondary | ICD-10-CM | POA: Diagnosis not present

## 2024-07-18 DIAGNOSIS — R051 Acute cough: Secondary | ICD-10-CM | POA: Diagnosis not present

## 2024-07-18 LAB — POC COVID19/FLU A&B COMBO
Covid Antigen, POC: NEGATIVE
Influenza A Antigen, POC: NEGATIVE
Influenza B Antigen, POC: NEGATIVE

## 2024-07-18 MED ORDER — PREDNISONE 20 MG PO TABS: 40.0000 mg | ORAL_TABLET | Freq: Every day | ORAL | 0 refills | Status: AC

## 2024-07-18 MED ORDER — PROMETHAZINE-DM 6.25-15 MG/5ML PO SYRP: 5.0000 mL | ORAL_SOLUTION | Freq: Four times a day (QID) | ORAL | 0 refills | Status: AC | PRN

## 2024-07-18 NOTE — Discharge Instructions (Signed)
 Start the prednisone  tablets tomorrow with breakfast.  You can use the cough medicine up to 4 times daily to help with your cough.  This should help loosen secretions as well.  This medication may cause drowsiness or sedation so do not drink alcohol or drive on it.  Continue to use your albuterol  as needed for any wheezing or shortness of breath.  The steroid should help with any tightness in your chest as well as your chest pain.  You can follow-up with your primary care provider regarding management of your asthma.   Symptoms should improve over the next few days with these medications.  If no improvement or any changes such as fever, worsening chest pain, shortness of breath, or continued wheezing, please return to clinic or seek follow-up care.

## 2024-07-18 NOTE — ED Triage Notes (Addendum)
 Patient reports symptoms starting Saturday night at Grandmother's house with Cough. Continuing Cough and when home yesterday it had remained but also noticing some sob with the cough today & sore throat. She feels tired/weak now. No fever known. Last Albuterol  use (nebulizer) just before appointment in Urgent Care.

## 2024-07-18 NOTE — ED Provider Notes (Signed)
 [facility]    CSN: [id number] Arrival date & time: 07/18/24  1608      History   Chief Complaint Chief Complaint  Patient presents with   Cough   Sore Throat   Nasal Congestion    HPI Carolyn Aguilar is a 18 y.o. female.   Patient presents to clinic over concern of cough that started over the weekend.  She has had some chest tightness for the past week and has been using her albuterol  for asthma for wheezing and shortness of breath.  The albuterol  does help.  Last use prior to arrival at clinic.  Denies fevers.  Has had some nasal congestion.  Reports sore throat, coughing seems to make this worse.  Has not had any recent hospitalizations for asthma.  Reports history of prediabetes.  The history is provided by the patient and medical records.  Cough Sore Throat    Past Medical History:  Diagnosis Date   Asthma    Eczema    Headache     Patient Active Problem List   Diagnosis Date Noted   Asthma with status asthmaticus 05/01/2012   Hypoxemia 05/01/2012    History reviewed. No pertinent surgical history.  OB History   No obstetric history on file.      Home Medications    Prior to Admission medications   Medication Sig Start Date End Date Taking? Authorizing Provider  albuterol  (PROVENTIL  HFA;VENTOLIN  HFA) 108 (90 Base) MCG/ACT inhaler Inhale 2 puffs into the lungs every 6 (six) hours as needed. For shortness of breath 09/21/16  Yes Muthersbaugh, Chiquita, PA-C  albuterol  (PROVENTIL ) (2.5 MG/3ML) 0.083% nebulizer solution Take 3 mLs (2.5 mg total) by nebulization every 6 (six) hours as needed for wheezing or shortness of breath. 05/26/22  Yes Billy Asberry FALCON, PA-C  budesonide -formoterol  (SYMBICORT ) 160-4.5 MCG/ACT inhaler Inhale 2 puffs into the lungs in the morning and at bedtime. 04/07/24  Yes Iva Marty Saltness, MD  FEROSUL 325 (65 Fe) MG tablet Take 325 mg by mouth daily. 05/03/24  Yes [provider]  predniSONE  (DELTASONE ) 20 MG  tablet Take 2 tablets (40 mg total) by mouth daily with breakfast for 5 days. 07/18/24 07/23/24 Yes Ernestine Rohman  N, FNP  promethazine-dextromethorphan (PROMETHAZINE-DM) 6.25-15 MG/5ML syrup Take 5 mLs by mouth 4 (four) times daily as needed for cough. 07/18/24  Yes Ethell Blatchford  N, FNP  amphetamine-dextroamphetamine (ADDERALL XR) 10 MG 24 hr capsule Take 10 mg by mouth every morning. 03/30/24   [provider]  budesonide  (PULMICORT ) 0.5 MG/2ML nebulizer solution Take by nebulization daily. 02/17/22   [provider]  clobetasol cream (TEMOVATE) 0.05 % Apply topically. 02/03/24   [provider]  famotidine (PEPCID) 40 MG tablet Take 40 mg by mouth daily. 01/16/24   [provider]  Ferrous Sulfate (IRON PO) Take by mouth.    [provider]  fluticasone  (FLONASE ) 50 MCG/ACT nasal spray Place 2 sprays into both nostrils daily. 02/17/22   [provider]  hydrOXYzine  (VISTARIL ) 25 MG capsule Take 1 capsule (25 mg total) by mouth at bedtime and may repeat dose one time if needed. 07/14/23   Ezzard Staci SAILOR, NP  hyoscyamine (LEVSIN) 0.125 MG tablet Take by mouth every morning. 09/18/21   [provider]  JUNEL FE 1.5/30 1.5-30 MG-MCG tablet Take 1 tablet by mouth daily. 02/14/22   [provider]  levocetirizine (XYZAL ) 5 MG tablet Take 1 tablet (5 mg total) by mouth every evening. 04/19/24   Iva,  Marty Saltness, MD  montelukast  (SINGULAIR ) 10 MG tablet Take 1 tablet (10 mg total) by mouth at bedtime. 04/19/24   Iva Marty Saltness, MD  omeprazole (PRILOSEC) 40 MG capsule TAKE 1 CAPSULE BY MOUTH EVERY DAY 30 MINUTES BEFORE BREAKFAST    [provider]  ondansetron  (ZOFRAN -ODT) 4 MG disintegrating tablet Take 1 tablet (4 mg total) by mouth every 8 (eight) hours as needed for nausea or vomiting. 06/14/24   Randa Stabs, NP  sertraline (ZOLOFT) 100 MG tablet Take 100 mg by mouth daily. 02/17/22   [provider]   topiramate  (TOPAMAX ) 25 MG tablet TAKE 1 TABLET(25 MG) BY MOUTH TWICE DAILY 12/08/22   Randa Stabs, NP  triamcinolone  ointment (KENALOG ) 0.1 % Apply 1 Application topically 2 (two) times daily. 04/07/24   Iva Marty Saltness, MD    Family History Family History  Problem Relation Age of Onset   Angioedema Mother    Allergic rhinitis Mother    Hypertension Mother    Eczema Maternal Uncle    Diabetes Paternal Grandfather    Migraines Neg Hx     Social History Social History   Tobacco Use   Smoking status: Never   Smokeless tobacco: Never  Vaping Use   Vaping status: Never Used  Substance Use Topics   Alcohol use: Never   Drug use: Never     Allergies   Patient has no known allergies.   Review of Systems Review of Systems   Physical Exam Triage Vital Signs ED Triage Vitals  Encounter Vitals Group     BP 07/18/24 1632 125/83     Girls Systolic BP Percentile --      Girls Diastolic BP Percentile --      Boys Systolic BP Percentile --      Boys Diastolic BP Percentile --      Pulse Rate 07/18/24 1632 (!) 102     Resp 07/18/24 1632 (!) 22     Temp 07/18/24 1632 98.5 F (36.9 C)     Temp Source 07/18/24 1632 Oral     SpO2 07/18/24 1632 95 %     Weight 07/18/24 1629 245 lb 9.5 oz (111.4 kg)     Height 07/18/24 1629 5' 0.25 (1.53 m)     Head Circumference --      Peak Flow --      Pain Score 07/18/24 1625 0     Pain Loc --      Pain Education --      Exclude from Growth Chart --    No data found.  Updated Vital Signs BP 125/83 (BP Location: Left Arm)   Pulse (!) 102   Temp 98.5 F (36.9 C) (Oral)   Resp 20   Ht 5' 0.25 (1.53 m)   Wt 245 lb 9.5 oz (111.4 kg)   LMP 06/27/2024 (Approximate)   SpO2 96%   BMI 47.57 kg/m   Visual Acuity Right Eye Distance:   Left Eye Distance:   Bilateral Distance:    Right Eye Near:   Left Eye Near:    Bilateral Near:     Physical Exam Vitals and nursing note reviewed.  Constitutional:      Appearance:  Normal appearance. She is well-developed.  HENT:     Head: Normocephalic and atraumatic.     Right Ear: External ear normal.     Left Ear: External ear normal.     Nose: No congestion.     Mouth/Throat:     Mouth:  Mucous membranes are moist.     Pharynx: Uvula midline. Posterior oropharyngeal erythema present.     Tonsils: No tonsillar exudate or tonsillar abscesses.  Eyes:     Conjunctiva/sclera: Conjunctivae normal.  Cardiovascular:     Rate and Rhythm: Normal rate and regular rhythm.     Heart sounds: Normal heart sounds. No murmur heard. Pulmonary:     Effort: Pulmonary effort is normal. No respiratory distress.     Breath sounds: Normal breath sounds. No wheezing.  Skin:    General: Skin is warm and dry.  Neurological:     General: No focal deficit present.     Mental Status: She is alert and oriented to person, place, and time.  Psychiatric:        Mood and Affect: Mood normal.        Behavior: Behavior normal.      UC Treatments / Results  Labs (all labs ordered are listed, but only abnormal results are displayed) Labs Reviewed  POC COVID19/FLU A&B COMBO - Normal    EKG   Radiology No results found.  Procedures Procedures (including critical care time)  Medications Ordered in UC Medications - No data to display  Initial Impression / Assessment and Plan / UC Course  I have reviewed the triage vital signs and the nursing notes.  Pertinent labs & imaging results that were available during my care of the patient were reviewed by me and considered in my medical decision making (see chart for details).  And triage reviewed, patient is hemodynamically stable.  Lungs vesicular, heart with regular rate and rhythm.  Central chest pain and tightness.  Good relief with albuterol .  Patient reports symptoms have been ongoing for the past week, consistent with asthma exacerbation.  Did do COVID and flu testing initially as she told staff symptoms started over the last  few days, COVID and flu testing negative.  Will treat with steroid burst.  Cough management discussed.  Lungs are vesicular.  Without fever or fatigue.  Will defer chest x-ray at this time, low clinical concern for pneumonia.  Plan of care, follow-up care return precautions given, no questions at this time.    Final Clinical Impressions(s) / UC Diagnoses   Final diagnoses:  Acute cough  Mild asthma with acute exacerbation, unspecified whether persistent     Discharge Instructions      Start the prednisone  tablets tomorrow with breakfast.  You can use the cough medicine up to 4 times daily to help with your cough.  This should help loosen secretions as well.  This medication may cause drowsiness or sedation so do not drink alcohol or drive on it.  Continue to use your albuterol  as needed for any wheezing or shortness of breath.  The steroid should help with any tightness in your chest as well as your chest pain.  You can follow-up with your primary care provider regarding management of your asthma.   Symptoms should improve over the next few days with these medications.  If no improvement or any changes such as fever, worsening chest pain, shortness of breath, or continued wheezing, please return to clinic or seek follow-up care.     ED Prescriptions     Medication Sig Dispense Auth. Provider   promethazine-dextromethorphan (PROMETHAZINE-DM) 6.25-15 MG/5ML syrup Take 5 mLs by mouth 4 (four) times daily as needed for cough. 118 mL Dreama, Prosper Paff  N, FNP   predniSONE  (DELTASONE ) 20 MG tablet Take 2 tablets (40 mg total) by mouth daily  with breakfast for 5 days. 10 tablet Dreama Barbaraann SAILOR, FNP      PDMP not reviewed this encounter.   Dreama, Emiah Pellicano  N, FNP 07/18/24 1728

## 2024-07-20 ENCOUNTER — Encounter: Payer: Self-pay | Admitting: Neurology

## 2024-07-21 ENCOUNTER — Ambulatory Visit: Admitting: Allergy & Immunology

## 2024-08-05 ENCOUNTER — Ambulatory Visit (INDEPENDENT_AMBULATORY_CARE_PROVIDER_SITE_OTHER): Admitting: Psychology

## 2024-08-05 ENCOUNTER — Encounter: Payer: Self-pay | Admitting: Psychology

## 2024-08-05 DIAGNOSIS — F411 Generalized anxiety disorder: Secondary | ICD-10-CM | POA: Diagnosis not present

## 2024-08-05 DIAGNOSIS — F331 Major depressive disorder, recurrent, moderate: Secondary | ICD-10-CM

## 2024-08-05 DIAGNOSIS — F840 Autistic disorder: Secondary | ICD-10-CM | POA: Diagnosis not present

## 2024-08-05 NOTE — Progress Notes (Signed)
 "   [facility] Counselor/Therapist Progress Note  Patient ID: JOCHEBED BILLS, MRN: [id number],    Date: 08/05/2024  Time Spent: 3:00 - 3:40 pm   Treatment Type: Individual Therapy Met with patient for therapy session.  Patient was at home and session was conducted from therapist's office via video conferencing.  Patient expressed awareness of the limitations related to video sessions and verbally consented to telehealth.     Reported Symptoms: Patient presents with a history of social interaction and cognitive processing deficits resulting in social and learning difficulty during childhood along with delayed independence as an adult. Patient has been previously diagnosed with ADHD, along with anxiety and depressive disorders, but Autism spectrum disorder (ASD) was indicated during recent psychological evaluation by this provider (April 2025).  Current concerns include depressed mood over not being able to get a job and a relationship recently ending.      Mental Status Exam: Appearance:  Casual and Neatly Groomed     Behavior: Appropriate  Motor: Normal  Speech/Language:  Clear and Coherent and Normal Rate  Affect: Restricted  Mood: Euthymic  Thought process: normal  Thought content:   WNL  Sensory/Perceptual disturbances:   WNL  Orientation: oriented to person, place, time/date, and situation  Attention: Good  Concentration: Good  Memory: WNL  Fund of knowledge:  Fair  Insight:   Fair  Judgment:  Good  Impulse Control: Good    Risk Assessment: Danger to Self:  No Self-injurious Behavior: No Danger to Others: No Duty to Warn:no Physical Aggression / Violence:No   Subjective: Patient stated that she has felt more depressed as she continues to struggle finding employment and a relationship she was involved in recently ended.  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  The biggest area of  concern at this time continues to be feeling jealous over others who have jobs of relationships.      Interventions: Cognitive Behavioral Therapy, Psychologist, Occupational, Mindfulness Meditation, and [employer], Executive function training Emphasis was on focusing on accepting that she cannot control what others have and putting her attention and emotional energy toward the effort she is making toward finding a job and establishing new relationships.  It was suggested to patient to participate in the autism society's adult support group.    Assessment: Patient needs to focus more on what she has rather than what she doesn't have and accept that it will take time and persistent effort in order for her to attain what she wants.      Diagnosis:Generalized anxiety disorder  Autism spectrum disorder  Major depressive disorder, recurrent episode, moderate (HCC)  Plan: Individual counseling is recommended to continue help Ms. Bubb learn approaches to manage her anxiety and depressed mood, while understanding more about compensations strategies for ASD and executive function deficits. Emphasis is on helping Ms. Marban learn social and independent skills in a graduated manner that prevents her from becoming overwhelmed.   Goal Enhance ability to effectively cope with the full variety of life's anxieties.  Objective Using coping skills to stay calm while making decisions. Target Date: 2025-01-15 Frequency: Biweekly  Progress: 60 Modality: individual   Goal Improve self esteem by increasing positive self statements.   Objective Make at least one positive self statement daily. Target Date: 2025-01-15 Frequency: Biweekly  Progress: 60 Modality: individual   Feleica Fulmore, PhD                                                 "

## 2024-08-29 ENCOUNTER — Encounter: Payer: Self-pay | Admitting: Psychology

## 2024-08-29 ENCOUNTER — Ambulatory Visit: Admitting: Psychology

## 2024-08-29 DIAGNOSIS — F411 Generalized anxiety disorder: Secondary | ICD-10-CM | POA: Diagnosis not present

## 2024-08-29 DIAGNOSIS — F331 Major depressive disorder, recurrent, moderate: Secondary | ICD-10-CM

## 2024-08-29 DIAGNOSIS — F840 Autistic disorder: Secondary | ICD-10-CM

## 2024-08-29 NOTE — Progress Notes (Signed)
 "   [facility] Counselor/Therapist Progress Note  Patient ID: Carolyn Aguilar, MRN: [id number],    Date: 08/29/2024  Time Spent: 7:00 - 7:30 pm   Treatment Type: Individual Therapy Met with patient for therapy session.  Patient was at home and session was conducted from therapist's office via video conferencing.  Patient expressed awareness of the limitations related to video sessions and verbally consented to telehealth.     Reported Symptoms: Patient presents with a history of social interaction and cognitive processing deficits resulting in social and learning difficulty during childhood along with delayed independence as an adult. Patient has been previously diagnosed with ADHD, along with anxiety and depressive disorders, but Autism spectrum disorder (ASD) was indicated during recent psychological evaluation by this provider (April 2025).  Current concerns include depressed mood over not being able to get a job and having trouble losing weight, along with the stress of having to care for her elderly grandmother.      Mental Status Exam: Appearance:  Casual and Neatly Groomed     Behavior: Appropriate  Motor: Normal  Speech/Language:  Clear and Coherent and Normal Rate  Affect: Restricted  Mood: Euthymic  Thought process: normal  Thought content:   WNL  Sensory/Perceptual disturbances:   WNL  Orientation: oriented to person, place, time/date, and situation  Attention: Good  Concentration: Good  Memory: WNL  Fund of knowledge:  Fair  Insight:   Fair  Judgment:  Good  Impulse Control: Good    Risk Assessment: Danger to Self:  No Self-injurious Behavior: No Danger to Others: No Duty to Warn:no Physical Aggression / Violence:No   Subjective: Patient stated that she has continued to feel anxious and depressed as she continues to struggle finding employment.  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  The biggest area of concern at this time consists of having trouble losing weight due to stress eating, along with the stress of having to care for her elderly grandmother.  She mentioned recently starting to exercise again going to the gym twice per week, along with coping with stress through drawing and playing computer games.  She was most stressed about others criticizing her for being overweight, along with her elderly grandmother (99 years) being irritable toward her.      Interventions: Cognitive Behavioral Therapy, Psychologist, Occupational, Mindfulness Meditation, and [employer], Executive function training Emphasis was on focusing her attention on thoughts that are helpful or positive, along with actions that are within her power to do.  Positive thinking and focusing on health rather than weight was also discussed.  Assessment: Patient needs to continue to focus more on what she has rather than what she doesn't have and accept that it will take time and persistent effort in order for her to attain what she wants.      Diagnosis:Generalized anxiety disorder  Autism spectrum disorder  Major depressive disorder, recurrent episode, moderate (HCC)  Plan: Individual counseling is recommended to continue help Ms. Bondy learn approaches to manage her anxiety and depressed mood, while understanding more about compensations strategies for ASD and executive function deficits. Emphasis is on helping Ms. Duque learn social and independent skills in a graduated manner that prevents her from becoming overwhelmed.   Goal Enhance ability to effectively cope with the full variety of life's anxieties.  Objective Using coping skills to stay calm while making decisions. Target Date: 2025-01-15 Frequency: Biweekly  Progress:  65 Modality: individual   Goal Improve self esteem by increasing positive self statements.   Objective Make at least one positive self statement daily. Target Date:  2025-01-15 Frequency: Biweekly  Progress: 65 Modality: individual   Katara Griner, PhD                                                               Keziah Drotar, PhD "

## 2024-08-30 ENCOUNTER — Other Ambulatory Visit: Payer: Self-pay

## 2024-08-30 ENCOUNTER — Ambulatory Visit: Admitting: Allergy & Immunology

## 2024-08-30 ENCOUNTER — Encounter: Payer: Self-pay | Admitting: Allergy & Immunology

## 2024-08-30 VITALS — BP 126/74 | HR 88 | Temp 98.0°F | Resp 18 | Ht 60.0 in | Wt 245.0 lb

## 2024-08-30 DIAGNOSIS — J302 Other seasonal allergic rhinitis: Secondary | ICD-10-CM

## 2024-08-30 DIAGNOSIS — H9193 Unspecified hearing loss, bilateral: Secondary | ICD-10-CM | POA: Diagnosis not present

## 2024-08-30 DIAGNOSIS — J3089 Other allergic rhinitis: Secondary | ICD-10-CM

## 2024-08-30 DIAGNOSIS — J4540 Moderate persistent asthma, uncomplicated: Secondary | ICD-10-CM | POA: Diagnosis not present

## 2024-08-30 DIAGNOSIS — L83 Acanthosis nigricans: Secondary | ICD-10-CM | POA: Diagnosis not present

## 2024-08-30 DIAGNOSIS — L2089 Other atopic dermatitis: Secondary | ICD-10-CM | POA: Diagnosis not present

## 2024-08-30 MED ORDER — BREZTRI AEROSPHERE 160-9-4.8 MCG/ACT IN AERO: 2.0000 | INHALATION_SPRAY | Freq: Two times a day (BID) | RESPIRATORY_TRACT | 5 refills | Status: AC

## 2024-08-30 NOTE — Progress Notes (Signed)
 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 Moderate persistent asthma, uncomplicated - Lung testing looks a bit lower than last time that. - We are going to change you from Symbicort  to Breztri  two puffs twice daily. - Breztri  contains a THIRD medication to help with your asthma control (a muscle relaxant that loosens the muscles around the lung tubes).  - Consider the addition of Fasenra for long-term management (this are covered by copay cards from the pharmaceutical companies).  - Nebulizer machine and demonstration provided.  - Daily controller medication(s): Breztri  160/9/4.72mcg two puffs twice daily - Prior to physical activity: albuterol  2 puffs 10-15 minutes before physical activity. - Rescue medications: albuterol  4 puffs every 4-6 hours as needed and albuterol  nebulizer one vial every 4-6 hours as needed - Changes during respiratory infections or worsening symptoms: Add on budesonide  nebulizer one treatment twice daily for 1-2 WEEKS - Asthma control goals:  * Full participation in all desired activities (may need albuterol  before activity) * Albuterol  use two time or less a week on average (not counting use with activity) * Cough interfering with sleep two time or less a month * Oral steroids no more than once a year * No hospitalizations  2. Chronic rhinitis - Testing at the last visit showed: grasses, ragweed, weeds, trees, indoor molds, outdoor molds, dust mites, and dog - STRONGLY consider allergy  shots for long-term control. - Continue with: Singulair  (montelukast ) 10mg  daily and Flonase  (fluticasone ) two sprays per nostril daily (AIM FOR EAR ON EACH SIDE) -  Continue with: Xyzal  (levocetirizine) 5mg  tablet once daily - You can use an extra dose of the antihistamine, if needed, for breakthrough symptoms.  - Consider nasal saline rinses 1-2 times daily to remove allergens from the nasal cavities as well as help with mucous clearance (this is especially helpful to do before the nasal sprays are given) - Allergy  shots re-train and reset the immune system to ignore environmental allergens and decrease the resulting immune response to those allergens (sneezing, itchy watery eyes, runny nose, nasal congestion, etc).    - Allergy  shots improve symptoms in 75-85% of patients.  - We can discuss more at the next appointment if the medications are not working for you. - Check insurance to see how much it will be.   3. Flexural atopic dermatitis - Continue with triamcinolone  0.1% ointment twice daily. - You can use this on your testing sites on your arm and back, too!  - Continue with the moisturizing.  4. Return in about 2 months (around 10/28/2024). You can have the follow up appointment with Dr. Iva or a Nurse Practicioner (our Nurse Practitioners are excellent and always have Physician oversight!).   Subjective:   Carolyn Aguilar is a 19 y.o. female presenting today for follow up of  Chief Complaint  Patient presents with   Follow-up    Still dealing with wheezy.    Eczema    Still itching.     Carolyn Aguilar has a history of the following: Patient Active Problem List   Diagnosis Date Noted   Asthma with status asthmaticus 05/01/2012   Hypoxemia 05/01/2012    History obtained from: chart review and patient.  Discussed the use of AI scribe software for clinical note transcription with the patient and/or guardian, who gave verbal consent to proceed.  Carolyn Aguilar is a 19 y.o. female presenting for a follow up visit.  She was last seen in September 2025.  At that time, Lyme testing was not done.  We continue with Symbicort  2 puffs twice daily  as well as albuterol  as needed.  She has budesonide  that she adds during flares.  She had testing that was positive to multiple indoor and outdoor allergens.  We continue with Singulair  as well as Flonase .  We also started Xyzal .  Atopic dermatitis was under good control with triamcinolone  twice daily as needed.  Since last visit, she has done well.  Asthma/Respiratory Symptom History: She is trying to use her Symbicort  two puffs BID. 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 Flare-ups occur, with the last significant one happening two weekends ago. Relief was achieved using her portable nebulizer. She uses her emergency albuterol  inhaler approximately twice a week as needed and is currently on a regimen of two puffs of her inhaler twice a day.  Allergic Rhinitis Symptom History: She experiences generalized itching, attributed to allergies, her dog, and outdoor exposure. A cream for the itching has been used with some success, though it was misplaced for a time. She experiences significant allergic reactions, including itching when walking through grass.  Skin Symptom History: She describes a skin condition characterized by dark patches that has been present for years. This condition has been present for years. She is not on medication for prediabetes but is attempting to lose weight, having lost one pound recently, with a goal to lose 100 pounds.  Her past medical history includes eczema, ADHD, autism diagnosed in April last year, generalized depression, generalized anxiety disorder, insomnia, and a hearing problem that requires her to ask people to repeat themselves. She has not seen an ENT specialist for her hearing issues.  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 She mentions the emotional and physical challenges of caregiving, especially as  her grandmother is often in pain and can be irritable.   Otherwise, there have been no changes to her past medical history, surgical history, family history, or social history.    Review of systems otherwise negative other than that mentioned in the HPI.    Objective:   Blood pressure 126/74, pulse 88, temperature 98 F (36.7 C), temperature source Temporal, resp. rate 18, height 5' (1.524 m), weight 245 lb (111.1 kg), SpO2 98%. Body mass index is 47.85 kg/m.    Physical Exam Vitals reviewed.  Constitutional:      Appearance: She is well-developed.  HENT:     Head: Normocephalic and atraumatic.     Right Ear: Tympanic membrane, ear canal and external ear normal. No drainage, swelling or tenderness. Tympanic membrane is not injected, scarred, erythematous, retracted or bulging.     Left Ear: Tympanic membrane, ear canal and external ear normal. No drainage, swelling or tenderness. Tympanic membrane is not injected, scarred, erythematous, retracted or bulging.     Nose: No nasal deformity, septal deviation, mucosal edema or rhinorrhea.     Right Turbinates: Enlarged, swollen and pale.     Left Turbinates: Enlarged, swollen and pale.     Right Sinus: No maxillary sinus tenderness or frontal sinus tenderness.     Left Sinus: No  maxillary sinus tenderness or frontal sinus tenderness.     Mouth/Throat:     Lips: Pink.     Mouth: Mucous membranes are moist. Mucous membranes are not pale and not dry.     Pharynx: Uvula midline.  Eyes:     General:        Right eye: No discharge.        Left eye: No discharge.     Conjunctiva/sclera: Conjunctivae normal.     Right eye: Right conjunctiva is not injected. No chemosis.    Left eye: Left conjunctiva is not injected. No chemosis.    Pupils: Pupils are equal, round, and reactive to light.  Cardiovascular:     Rate and Rhythm: Normal rate and regular rhythm.     Heart sounds: Normal heart sounds.  Pulmonary:     Effort: Pulmonary  effort is normal. No tachypnea, accessory muscle usage or respiratory distress.     Breath sounds: Normal breath sounds. No wheezing, rhonchi or rales.     Comments: Moving air well in all lung fields. No increased work of breathing noted. Chest:     Chest wall: No tenderness.  Abdominal:     Tenderness: There is no abdominal tenderness. There is no guarding or rebound.  Lymphadenopathy:     Head:     Right side of head: No submandibular, tonsillar or occipital adenopathy.     Left side of head: No submandibular, tonsillar or occipital adenopathy.     Cervical: No cervical adenopathy.  Skin:    Coloration: Skin is not pale.     Findings: No abrasion, erythema, petechiae or rash. Rash is not papular, urticarial or vesicular.  Neurological:     Mental Status: She is alert.  Psychiatric:        Behavior: Behavior is cooperative.      Diagnostic studies:    Spirometry: results normal (FEV1: 1.78/69%, FVC: 2.48/85%, FEV1/FVC: 72%).    Spirometry consistent with mild obstructive disease.    Allergy  Studies: none        Marty Shaggy, MD  [facility]        "

## 2024-08-30 NOTE — Patient Instructions (Addendum)
 1. Moderate persistent asthma, uncomplicated - Lung testing looks a bit lower than last time that. - We are going to change you from Symbicort  to Breztri  two puffs twice daily. - Breztri  contains a THIRD medication to help with your asthma control (a muscle relaxant that loosens the muscles around the lung tubes).  - Consider the addition of Fasenra for long-term management (this are covered by copay cards from the pharmaceutical companies).  - Nebulizer machine and demonstration provided.  - Daily controller medication(s): Breztri  160/9/4.74mcg two puffs twice daily - Prior to physical activity: albuterol  2 puffs 10-15 minutes before physical activity. - Rescue medications: albuterol  4 puffs every 4-6 hours as needed and albuterol  nebulizer one vial every 4-6 hours as needed - Changes during respiratory infections or worsening symptoms: Add on budesonide  nebulizer one treatment twice daily for 1-2 WEEKS - Asthma control goals:  * Full participation in all desired activities (may need albuterol  before activity) * Albuterol  use two time or less a week on average (not counting use with activity) * Cough interfering with sleep two time or less a month * Oral steroids no more than once a year * No hospitalizations  2. Chronic rhinitis - Testing at the last visit showed: grasses, ragweed, weeds, trees, indoor molds, outdoor molds, dust mites, and dog - STRONGLY consider allergy  shots for long-term control. - Continue with: Singulair  (montelukast ) 10mg  daily and Flonase  (fluticasone ) two sprays per nostril daily (AIM FOR EAR ON EACH SIDE) - Continue with: Xyzal  (levocetirizine) 5mg  tablet once daily - You can use an extra dose of the antihistamine, if needed, for breakthrough symptoms.  - Consider nasal saline rinses 1-2 times daily to remove allergens from the nasal cavities as well as help with mucous clearance (this is especially helpful to do before the nasal sprays are given) - Allergy  shots  re-train and reset the immune system to ignore environmental allergens and decrease the resulting immune response to those allergens (sneezing, itchy watery eyes, runny nose, nasal congestion, etc).    - Allergy  shots improve symptoms in 75-85% of patients.  - We can discuss more at the next appointment if the medications are not working for you. - Check insurance to see how much it will be.   3. Flexural atopic dermatitis - Continue with triamcinolone  0.1% ointment twice daily. - You can use this on your testing sites on your arm and back, too!  - Continue with the moisturizing.  4. Return in about 2 months (around 10/28/2024). You can have the follow up appointment with Dr. Iva or a Nurse Practicioner (our Nurse Practitioners are excellent and always have Physician oversight!).    Please inform us  of any Emergency Department visits, hospitalizations, or changes in symptoms. Call us  before going to the ED for breathing or allergy  symptoms since we might be able to fit you in for a sick visit. Feel free to contact us  anytime with any questions, problems, or concerns.  It was a pleasure to see you again today!  Websites that have reliable patient information: 1. American Academy of Asthma, Allergy , and Immunology: www.aaaai.org 2. Food Allergy  Research and Education (FARE): foodallergy.org 3. Mothers of Asthmatics: http://www.asthmacommunitynetwork.org 4. American College of Allergy , Asthma, and Immunology: www.acaai.org      Like us  on [employer] and Instagram for our latest updates!      A healthy democracy works best when [employer] participate! Make sure you are registered to vote! If you have moved or changed any of your contact information,  you will need to get this updated before voting! Scan the QR codes below to learn more!      Allergy  Shots  Allergies are the result of a chain reaction that starts in the immune system. Your immune system controls how your body  defends itself. For instance, if you have an allergy  to pollen, your immune system identifies pollen as an invader or allergen. Your immune system overreacts by producing antibodies called Immunoglobulin E (IgE). These antibodies travel to cells that release chemicals, causing an allergic reaction.  The concept behind allergy  immunotherapy, whether it is received in the form of shots or tablets, is that the immune system can be desensitized to specific allergens that trigger allergy  symptoms. Although it requires time and patience, the payback can be long-term relief. Allergy  injections contain a dilute solution of those substances that you are allergic to based upon your skin testing and allergy  history.   How Do Allergy  Shots Work?  Allergy  shots work much like a vaccine. Your body responds to injected amounts of a particular allergen given in increasing doses, eventually developing a resistance and tolerance to it. Allergy  shots can lead to decreased, minimal or no allergy  symptoms.  There generally are two phases: build-up and maintenance. Build-up often ranges from three to six months and involves receiving injections with increasing amounts of the allergens. The shots are typically given once or twice a week, though more rapid build-up schedules are sometimes used.  The maintenance phase begins when the most effective dose is reached. This dose is different for each person, depending on how allergic you are and your response to the build-up injections. Once the maintenance dose is reached, there are longer periods between injections, typically two to four weeks.  Occasionally doctors give cortisone-type shots that can temporarily reduce allergy  symptoms. These types of shots are different and should not be confused with allergy  immunotherapy shots.  Who Can Be Treated with Allergy  Shots?  Allergy  shots may be a good treatment approach for people with allergic rhinitis (hay fever), allergic  asthma, conjunctivitis (eye allergy ) or stinging insect allergy .   Before deciding to begin allergy  shots, you should consider:   The length of allergy  season and the severity of your symptoms  Whether medications and/or changes to your environment can control your symptoms  Your desire to avoid long-term medication use  Time: allergy  immunotherapy requires a major time commitment  Cost: may vary depending on your insurance coverage  Allergy  shots for children age 16 and older are effective and often well tolerated. They might prevent the onset of new allergen sensitivities or the progression to asthma.  Allergy  shots are not started on patients who are pregnant but can be continued on patients who become pregnant while receiving them. In some patients with other medical conditions or who take certain common medications, allergy  shots may be of risk. It is important to mention other medications you talk to your allergist.   What are the two types of build-ups offered:   RUSH or Rapid Desensitization -- one day of injections lasting from 8:30-4:30pm, injections every 1 hour.  Approximately half of the build-up process is completed in that one day.  The following week, normal build-up is resumed, and this entails ~16 visits either weekly or twice weekly, until reaching your maintenance dose which is continued weekly until eventually getting spaced out to every month for a duration of 3 to 5 years. The regular build-up appointments are nurse visits where the injections are  administered, followed by required monitoring for 30 minutes.    Traditional build-up -- weekly visits for 6 -12 months until reaching maintenance dose, then continue weekly until eventually spacing out to every 4 weeks as above. At these appointments, the injections are administered, followed by required monitoring for 30 minutes.     Either way is acceptable, and both are equally effective. With the rush protocol, the  advantage is that less time is spent here for injections overall AND you would also reach maintenance dosing faster (which is when the clinical benefit starts to become more apparent). Not everyone is a candidate for rapid desensitization.   IF we proceed with the RUSH protocol, there are premedications which must be taken the day before and the day after the rush only (this includes antihistamines, steroids, and Singulair ).  After the rush day, no prednisone  or Singulair  is required, and we just recommend antihistamines taken on your injection day.  What Is An Estimate of the Costs?  If you are interested in starting allergy  injections, please check with your insurance company about your coverage for both allergy  vial sets and allergy  injections.  Please do so prior to making the appointment to start injections.  The following are CPT codes to give to your insurance company. These are the amounts we BILL to the insurance company, but the amount YOU WILL PAY and WE RECEIVE IS SUBSTANTIALLY LESS and depends on the contracts we have with different insurance companies.   Amount Billed to Insurance Two allergy  vial set  CPT 95165   $ 2400     Two injections   CPT 95117   $ 40  Regarding the allergy  injections, your co-pay may or may not apply with each injection, so please confirm this with your insurance company. When you start allergy  injections, 1 or 2 sets of vials are made based on your allergies.  Not all patients can be on one set of vials. A set of vials lasts 6 months to a year depending on how quickly you can proceed with your build-up of your allergy  injections. Vials are personalized for each patient depending on their specific allergens.  How often are allergy  injection given during the build-up period?   Injections are given at least weekly during the build-up period until your maintenance dose is achieved. Per the doctor's discretion, you may have the option of getting allergy  injections  two times per week during the build-up period. However, there must be at least 48 hours between injections. The build-up period is usually completed within 6-12 months depending on your ability to schedule injections and for adjustments for reactions. When maintenance dose is reached, your injection schedule is gradually changed to every two weeks and later to every three weeks. Injections will then continue every 4 weeks. Usually, injections are continued for a total of 3-5 years.   When Will I Feel Better?  Some may experience decreased allergy  symptoms during the build-up phase. For others, it may take as long as 12 months on the maintenance dose. If there is no improvement after a year of maintenance, your allergist will discuss other treatment options with you.  If you arent responding to allergy  shots, it may be because there is not enough dose of the allergen in your vaccine or there are missing allergens that were not identified during your allergy  testing. Other reasons could be that there are high levels of the allergen in your environment or major exposure to non-allergic triggers like tobacco smoke.  What Is the Length of Treatment?  Once the maintenance dose is reached, allergy  shots are generally continued for three to five years. The decision to stop should be discussed with your allergist at that time. Some people may experience a permanent reduction of allergy  symptoms. Others may relapse and a longer course of allergy  shots can be considered.  What Are the Possible Reactions?  The two types of adverse reactions that can occur with allergy  shots are local and systemic. Common local reactions include very mild redness and swelling at the injection site, which can happen immediately or several hours after. Report a delayed reaction from your last injection. These include arm swelling or runny nose, watery eyes or cough that occurs within 12-24 hours after injection. A systemic reaction,  which is less common, affects the entire body or a particular body system. They are usually mild and typically respond quickly to medications. Signs include increased allergy  symptoms such as sneezing, a stuffy nose or hives.   Rarely, a serious systemic reaction called anaphylaxis can develop. Symptoms include swelling in the throat, wheezing, a feeling of tightness in the chest, nausea or dizziness. Most serious systemic reactions develop within 30 minutes of allergy  shots. This is why it is strongly recommended you wait in your doctors office for 30 minutes after your injections. Your allergist is trained to watch for reactions, and his or her staff is trained and equipped with the proper medications to identify and treat them.   Report to the nurse immediately if you experience any of the following symptoms: swelling, itching or redness of the skin, hives, watery eyes/nose, breathing difficulty, excessive sneezing, coughing, stomach pain, diarrhea, or light headedness. These symptoms may occur within 15-20 minutes after injection and may require medication.   Who Should Administer Allergy  Shots?  The preferred location for receiving shots is your prescribing allergists office. Injections can sometimes be given at another facility where the physician and staff are trained to recognize and treat reactions, and have received instructions by your prescribing allergist.  What if I am late for an injection?   Injection dose will be adjusted depending upon how many days or weeks you are late for your injection.   What if I am sick?   Please report any illness to the nurse before receiving injections. She may adjust your dose or postpone injections depending on your symptoms. If you have fever, flu, sinus infection or chest congestion it is best to postpone allergy  injections until you are better. Never get an allergy  injection if your asthma is causing you problems. If your symptoms persist, seek out  medical care to get your health problem under control.  What If I am or Become Pregnant:  Women that become pregnant should schedule an appointment with [facility] before receiving any further allergy  injections.  Check out this information handout from the [employer] of Asthma, Allergy , and Immunology on allergy  shots!   [email or web address]

## 2024-09-27 ENCOUNTER — Ambulatory Visit: Admitting: Psychology

## 2024-10-21 ENCOUNTER — Ambulatory Visit: Admitting: Psychology

## 2024-11-01 ENCOUNTER — Ambulatory Visit: Admitting: Allergy & Immunology

## 2024-11-07 ENCOUNTER — Ambulatory Visit: Admitting: Neurology
